# Patient Record
Sex: Female | Born: 1995 | Race: White | Hispanic: No | Marital: Married | Smoking: Never smoker
Health system: Southern US, Community
[De-identification: ages and names within clinical notes are randomized; demographics above are authoritative.]

## PROBLEM LIST (undated history)

## (undated) DIAGNOSIS — Z789 Other specified health status: Secondary | ICD-10-CM

## (undated) HISTORY — PX: WISDOM TOOTH EXTRACTION: SHX21

---
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
## 2017-02-08 DIAGNOSIS — H5203 Hypermetropia, bilateral: Secondary | ICD-10-CM | POA: Diagnosis not present

## 2017-06-07 DIAGNOSIS — N9489 Other specified conditions associated with female genital organs and menstrual cycle: Secondary | ICD-10-CM | POA: Diagnosis not present

## 2018-02-09 DIAGNOSIS — H5213 Myopia, bilateral: Secondary | ICD-10-CM | POA: Diagnosis not present

## 2018-02-19 DIAGNOSIS — Z131 Encounter for screening for diabetes mellitus: Secondary | ICD-10-CM | POA: Diagnosis not present

## 2018-05-20 DIAGNOSIS — Z3009 Encounter for other general counseling and advice on contraception: Secondary | ICD-10-CM | POA: Diagnosis not present

## 2018-08-11 DIAGNOSIS — F419 Anxiety disorder, unspecified: Secondary | ICD-10-CM | POA: Diagnosis not present

## 2018-09-08 DIAGNOSIS — F419 Anxiety disorder, unspecified: Secondary | ICD-10-CM | POA: Diagnosis not present

## 2018-10-21 DIAGNOSIS — Z23 Encounter for immunization: Secondary | ICD-10-CM | POA: Diagnosis not present

## 2019-02-05 DIAGNOSIS — H5203 Hypermetropia, bilateral: Secondary | ICD-10-CM | POA: Diagnosis not present

## 2019-02-13 DIAGNOSIS — F419 Anxiety disorder, unspecified: Secondary | ICD-10-CM | POA: Diagnosis not present

## 2019-02-13 DIAGNOSIS — Z8279 Family history of other congenital malformations, deformations and chromosomal abnormalities: Secondary | ICD-10-CM | POA: Diagnosis not present

## 2019-02-16 ENCOUNTER — Other Ambulatory Visit: Payer: Self-pay | Admitting: Physician Assistant

## 2019-02-16 ENCOUNTER — Other Ambulatory Visit (HOSPITAL_COMMUNITY): Payer: Self-pay | Admitting: Physician Assistant

## 2019-02-16 DIAGNOSIS — Z8279 Family history of other congenital malformations, deformations and chromosomal abnormalities: Secondary | ICD-10-CM

## 2019-02-24 ENCOUNTER — Encounter: Payer: Self-pay | Admitting: Physician Assistant

## 2019-03-10 ENCOUNTER — Telehealth: Payer: Self-pay

## 2019-03-10 NOTE — Telephone Encounter (Signed)
New message    Just an FYI. We have made several attempts to contact this patient including sending a letter to schedule or reschedule their echocardiogram. We will be removing the patient from the echo WQ.   Thank you 

## 2019-03-27 DIAGNOSIS — F419 Anxiety disorder, unspecified: Secondary | ICD-10-CM | POA: Diagnosis not present

## 2019-04-28 MED FILL — ESCITALOPRAM 10 MG TABLET: 10 | 30 days supply | Qty: 30 | Fill #0

## 2019-04-28 MED FILL — TRI FEMYNOR 28 TABLET: 0.18/0.215/ | 84 days supply | Qty: 84 | Fill #0

## 2021-09-21 DIAGNOSIS — H5203 Hypermetropia, bilateral: Secondary | ICD-10-CM | POA: Diagnosis not present

## 2022-02-14 DIAGNOSIS — Z113 Encounter for screening for infections with a predominantly sexual mode of transmission: Secondary | ICD-10-CM | POA: Diagnosis not present

## 2022-02-14 DIAGNOSIS — Z0000 Encounter for general adult medical examination without abnormal findings: Secondary | ICD-10-CM | POA: Diagnosis not present

## 2022-02-14 DIAGNOSIS — F411 Generalized anxiety disorder: Secondary | ICD-10-CM | POA: Diagnosis not present

## 2022-03-21 DIAGNOSIS — F411 Generalized anxiety disorder: Secondary | ICD-10-CM | POA: Diagnosis not present

## 2022-03-21 DIAGNOSIS — R7989 Other specified abnormal findings of blood chemistry: Secondary | ICD-10-CM | POA: Diagnosis not present

## 2022-03-24 ENCOUNTER — Other Ambulatory Visit: Payer: Self-pay

## 2022-03-24 ENCOUNTER — Emergency Department (HOSPITAL_BASED_OUTPATIENT_CLINIC_OR_DEPARTMENT_OTHER)
Admission: EM | Admit: 2022-03-24 | Discharge: 2022-03-24 | Disposition: A | Discharge status: Home / Self Care | Payer: No Typology Code available for payment source | Attending: Emergency Medicine | Admitting: Emergency Medicine

## 2022-03-24 ENCOUNTER — Encounter (HOSPITAL_BASED_OUTPATIENT_CLINIC_OR_DEPARTMENT_OTHER): Payer: Self-pay

## 2022-03-24 DIAGNOSIS — S51831A Puncture wound without foreign body of right forearm, initial encounter: Secondary | ICD-10-CM | POA: Diagnosis not present

## 2022-03-24 DIAGNOSIS — W5501XA Bitten by cat, initial encounter: Secondary | ICD-10-CM | POA: Insufficient documentation

## 2022-03-24 DIAGNOSIS — S51851A Open bite of right forearm, initial encounter: Secondary | ICD-10-CM

## 2022-03-24 DIAGNOSIS — S59911A Unspecified injury of right forearm, initial encounter: Secondary | ICD-10-CM | POA: Diagnosis present

## 2022-03-24 MED ORDER — DOXYCYCLINE HYCLATE 100 MG PO TABS
100.0000 mg | ORAL_TABLET | Freq: Once | ORAL | Status: AC
  Administered 2022-03-24: 100 mg via ORAL
  Filled 2022-03-24: qty 1

## 2022-03-24 MED ORDER — DOXYCYCLINE HYCLATE 100 MG PO CAPS: 100.0000 mg | ORAL_CAPSULE | Freq: Two times a day (BID) | ORAL | 0 refills | Status: AC

## 2022-03-24 NOTE — ED Notes (Signed)
ED Provider at bedside. 

## 2022-03-24 NOTE — Discharge Instructions (Addendum)
Fortunately, your wound does not seem to be severe today.  We thoroughly soaked it in solution and wrapped it.  You can change the dressing once daily.  Wash the area with soap and water, please avoid using antiseptic such as hydrogen peroxide or alcohol as this can delay healing.  Your first dose of antibiotic was given here in the emergency department, your next dose will be taken tomorrow, and you will take this twice daily for 5 days.  Return if you develop signs of infection including increased warmth, redness, drainage or worsening pain.  You may feel sore for the next couple of days -I recommend taking ibuprofen or Tylenol as needed. ?
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
## 2022-03-24 NOTE — ED Provider Notes (Signed)
?[name] ?Provider Note ? ? ?CSN: [id number] ?Arrival date & time: 03/24/22  1633 ? ?  ? ?History ? ?Chief Complaint  ?Patient presents with  ? Animal Bite  ? ? ?Cynthia Nolan is a 26 y.o. female who presents to the ED for evaluation of cat bite of her right forearm that occurred while at work today.  Patient works at an Program[employer], systems and when attempting to place the young cat into the carrier in order to go to his new home, the cat became stressed and bit her.  Reportedly flush the puncture wounds out with a syringe of alcohol while at the animal shelter prior to arrival.  Patient denies numbness and tingling but reports soreness/achiness.  She has no other complaints. ? ? ?Animal Bite ? ?  ? ?Home Medications ?Prior to Admission medications   ?Medication Sig Start Date End Date Taking? Authorizing Provider  ?doxycycline (VIBRAMYCIN) 100 MG capsule Take 1 capsule (100 mg total) by mouth 2 (two) times daily for 5 days. 03/24/22 03/29/22 Yes Tonye Pearson, PA-C  ?   ? ?Allergies    ?Amoxicillin   ? ?Review of Systems   ?Review of Systems ? ?Physical Exam ?Updated Vital Signs ?BP 124/88 (BP Location: Left Arm)   Pulse 82   Temp 98.5 ?F (36.9 ?C) (Oral)   Resp 16   Ht 5' 5.75" (1.67 m)   Wt 72.6 kg   LMP 03/15/2022 (Approximate)   SpO2 100%   BMI 26.02 kg/m?  ?Physical Exam ?Vitals and nursing note reviewed.  ?Constitutional:   ?   General: She is not in acute distress. ?   Appearance: She is not ill-appearing.  ?HENT:  ?   Head: Atraumatic.  ?Eyes:  ?   Conjunctiva/sclera: Conjunctivae normal.  ?Cardiovascular:  ?   Rate and Rhythm: Normal rate and regular rhythm.  ?   Pulses: Normal pulses.     ?     Radial pulses are 2+ on the right side and 2+ on the left side.  ?   Heart sounds: No murmur heard. ?Pulmonary:  ?   Effort: Pulmonary effort is normal. No respiratory distress.  ?   Breath sounds: Normal breath sounds.  ?Abdominal:  ?   General: Abdomen is flat. There is no  distension.  ?   Palpations: Abdomen is soft.  ?   Tenderness: There is no abdominal tenderness.  ?Musculoskeletal:     ?   General: Normal range of motion.  ?   Cervical back: Normal range of motion.  ?Skin: ?   General: Skin is warm and dry.  ?   Capillary Refill: Capillary refill takes less than 2 seconds.  ?   Comments: Four small puncture wounds and scratches to right arm  ?Neurological:  ?   General: No focal deficit present.  ?   Mental Status: She is alert.  ?Psychiatric:     ?   Mood and Affect: Mood normal.  ? ? ?ED Results / Procedures / Treatments   ?Labs ?(all labs ordered are listed, but only abnormal results are displayed) ?Labs Reviewed - No data to display ? ?EKG ?None ? ?Radiology ?No results found. ? ?Procedures ?Procedures  ? ? ?Medications Ordered in ED ?Medications  ?doxycycline (VIBRA-TABS) tablet 100 mg (has no administration in time range)  ? ? ?ED Course/ Medical Decision Making/ A&P ?  ?                        ?  Medical Decision Making ?Risk ?Prescription drug management. ? ? ?History:  ?Per HPI ?Social determinants of health: none ? ?Initial impression: ? ?This patient presents to the ED for concern of cat bite, this involves an extensive number of treatment options, and is a complaint that carries with it a high risk of complications and morbidity.    ?Patient is overall well-appearing, no acute distress.  Her vitals are normal.  The wound itself is small puncture wounds and superficial scratches that have already scabbed over.  No laceration repair required.  Wounds will be soaked in Betadine solution for approximately 15 to 20 minutes.  First dose of antibiotics given here in the emergency department.  Patient has an allergy to amoxicillin, so will provide doxycycline instead.  ? ? ?Medicines ordered and prescription drug management: ? ?I ordered medication including: ?Doxycycline 100mg    ?I have reviewed the patients home medicines and have made adjustments as  needed ? ? ?Disposition: ? ?After consideration of the diagnostic results, physical exam, history and the patients response to treatment feel that the patent would benefit from discharge.   ?Cat bite: First dose of antibiotics given here in the emergency department.  Patient advised on signs and symptoms that warrant emergent return to the ED for evaluation.  Patient inquires about whether or not she still able to get a tattoo in 2 days given that she is on antibiotics.  Pt states Internet search informed her that tattoo while on antibiotics can affect the way thte ink sets.  No contraindications medically for this found on up-to-date, however advised patient to either postpone, or to consult with her tattoo artist if the concern is more aesthetic.  Patient expresses understanding is amenable to plan.  Discharged home in good condition. ? ? ?Final Clinical Impression(s) / ED Diagnoses ?Final diagnoses:  ?Cat bite of forearm, right, initial encounter  ? ? ?Rx / DC Orders ?ED Discharge Orders   ? ?      Ordered  ?  doxycycline (VIBRAMYCIN) 100 MG capsule  2 times daily       ? 03/24/22 1721  ? ?  ?  ? ?  ? ? ?  ?Tonye Pearson, [place] ?03/24/22 1741 ? ?  ?Drenda Freeze, MD ?03/24/22 2256 ? ?

## 2022-04-18 ENCOUNTER — Other Ambulatory Visit (HOSPITAL_COMMUNITY): Payer: Self-pay

## 2022-04-18 MED ORDER — CITALOPRAM HYDROBROMIDE 10 MG PO TABS
ORAL_TABLET | ORAL | 2 refills | Status: DC
  Filled 2022-04-18 – 2022-04-27 (×2): qty 30, 30d supply, fill #0

## 2022-04-26 ENCOUNTER — Other Ambulatory Visit (HOSPITAL_COMMUNITY): Payer: Self-pay

## 2022-04-27 ENCOUNTER — Ambulatory Visit
Admission: EM | Admit: 2022-04-27 | Discharge: 2022-04-27 | Disposition: A | Discharge status: Home / Self Care | Payer: 59 | Attending: Nurse Practitioner | Admitting: Nurse Practitioner

## 2022-04-27 ENCOUNTER — Other Ambulatory Visit (HOSPITAL_COMMUNITY): Payer: Self-pay

## 2022-04-27 DIAGNOSIS — S161XXA Strain of muscle, fascia and tendon at neck level, initial encounter: Secondary | ICD-10-CM

## 2022-04-27 MED ORDER — IBUPROFEN 800 MG PO TABS
800.0000 mg | ORAL_TABLET | Freq: Three times a day (TID) | ORAL | 0 refills | Status: DC | PRN
  Filled 2022-04-27: qty 30, 10d supply, fill #0

## 2022-04-27 MED ORDER — ONDANSETRON 4 MG PO TBDP
4.0000 mg | ORAL_TABLET | Freq: Three times a day (TID) | ORAL | 0 refills | Status: DC | PRN
  Filled 2022-04-27: qty 20, 7d supply, fill #0

## 2022-04-27 MED ORDER — CYCLOBENZAPRINE HCL 10 MG PO TABS
10.0000 mg | ORAL_TABLET | Freq: Two times a day (BID) | ORAL | 0 refills | Status: DC | PRN
  Filled 2022-04-27: qty 20, 10d supply, fill #0
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
## 2022-04-27 NOTE — Discharge Instructions (Addendum)
Take medication as prescribed. ?Warm compresses or warm showers to help with your symptoms. ?Gentle range of motion and stretching to help promote mobility of your neck. ?Activity as tolerated. ?Follow-up if your symptoms do not improve within the next 10 to 14 days. ?
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
## 2022-07-16 ENCOUNTER — Other Ambulatory Visit (HOSPITAL_COMMUNITY): Payer: Self-pay

## 2022-07-16 MED ORDER — CITALOPRAM HYDROBROMIDE 10 MG PO TABS
ORAL_TABLET | ORAL | 2 refills | Status: DC
  Filled 2022-07-16 – 2022-08-29 (×2): qty 30, 30d supply, fill #0

## 2022-07-25 ENCOUNTER — Other Ambulatory Visit (HOSPITAL_COMMUNITY): Payer: Self-pay

## 2022-08-29 ENCOUNTER — Other Ambulatory Visit (HOSPITAL_COMMUNITY): Payer: Self-pay

## 2022-09-27 DIAGNOSIS — H5203 Hypermetropia, bilateral: Secondary | ICD-10-CM | POA: Diagnosis not present

## 2022-10-05 ENCOUNTER — Other Ambulatory Visit (HOSPITAL_COMMUNITY): Payer: Self-pay

## 2022-10-05 MED ORDER — CITALOPRAM HYDROBROMIDE 10 MG PO TABS
ORAL_TABLET | ORAL | 2 refills | Status: DC
  Filled 2022-10-05: qty 30, 30d supply, fill #0

## 2022-11-07 ENCOUNTER — Other Ambulatory Visit (HOSPITAL_COMMUNITY): Payer: Self-pay

## 2022-11-07 DIAGNOSIS — Z3201 Encounter for pregnancy test, result positive: Secondary | ICD-10-CM | POA: Diagnosis not present

## 2022-11-07 DIAGNOSIS — R300 Dysuria: Secondary | ICD-10-CM | POA: Diagnosis not present

## 2022-11-07 MED ORDER — NITROFURANTOIN MONOHYD MACRO 100 MG PO CAPS
100.0000 mg | ORAL_CAPSULE | Freq: Two times a day (BID) | ORAL | 0 refills | Status: DC
  Filled 2022-11-07: qty 10, 5d supply, fill #0

## 2022-11-20 DIAGNOSIS — Z23 Encounter for immunization: Secondary | ICD-10-CM | POA: Diagnosis not present

## 2022-11-20 DIAGNOSIS — Z3201 Encounter for pregnancy test, result positive: Secondary | ICD-10-CM | POA: Diagnosis not present

## 2022-12-05 DIAGNOSIS — Z368A Encounter for antenatal screening for other genetic defects: Secondary | ICD-10-CM | POA: Diagnosis not present

## 2022-12-05 DIAGNOSIS — O26891 Other specified pregnancy related conditions, first trimester: Secondary | ICD-10-CM | POA: Diagnosis not present

## 2022-12-05 DIAGNOSIS — Z1151 Encounter for screening for human papillomavirus (HPV): Secondary | ICD-10-CM | POA: Diagnosis not present

## 2022-12-05 DIAGNOSIS — Z3A09 9 weeks gestation of pregnancy: Secondary | ICD-10-CM | POA: Diagnosis not present

## 2022-12-05 DIAGNOSIS — Z3401 Encounter for supervision of normal first pregnancy, first trimester: Secondary | ICD-10-CM | POA: Diagnosis not present

## 2022-12-05 DIAGNOSIS — Z113 Encounter for screening for infections with a predominantly sexual mode of transmission: Secondary | ICD-10-CM | POA: Diagnosis not present

## 2022-12-05 DIAGNOSIS — Z369 Encounter for antenatal screening, unspecified: Secondary | ICD-10-CM | POA: Diagnosis not present

## 2022-12-05 DIAGNOSIS — Z124 Encounter for screening for malignant neoplasm of cervix: Secondary | ICD-10-CM | POA: Diagnosis not present

## 2023-02-12 DIAGNOSIS — Z3402 Encounter for supervision of normal first pregnancy, second trimester: Secondary | ICD-10-CM | POA: Diagnosis not present

## 2023-02-12 DIAGNOSIS — Z3A18 18 weeks gestation of pregnancy: Secondary | ICD-10-CM | POA: Diagnosis not present

## 2023-02-12 DIAGNOSIS — Z363 Encounter for antenatal screening for malformations: Secondary | ICD-10-CM | POA: Diagnosis not present

## 2023-03-12 DIAGNOSIS — Z3A22 22 weeks gestation of pregnancy: Secondary | ICD-10-CM | POA: Diagnosis not present

## 2023-03-12 DIAGNOSIS — Z3402 Encounter for supervision of normal first pregnancy, second trimester: Secondary | ICD-10-CM | POA: Diagnosis not present

## 2023-03-12 DIAGNOSIS — Z362 Encounter for other antenatal screening follow-up: Secondary | ICD-10-CM | POA: Diagnosis not present

## 2023-04-10 DIAGNOSIS — Z23 Encounter for immunization: Secondary | ICD-10-CM | POA: Diagnosis not present

## 2023-04-10 DIAGNOSIS — Z3689 Encounter for other specified antenatal screening: Secondary | ICD-10-CM | POA: Diagnosis not present

## 2023-04-11 ENCOUNTER — Other Ambulatory Visit (HOSPITAL_COMMUNITY): Payer: Self-pay

## 2023-04-11 MED ORDER — ACCRUFER 30 MG PO CAPS
30.0000 mg | ORAL_CAPSULE | Freq: Every day | ORAL | 2 refills | Status: DC
  Filled 2023-04-11: qty 30, 30d supply, fill #0

## 2023-04-16 DIAGNOSIS — O99810 Abnormal glucose complicating pregnancy: Secondary | ICD-10-CM | POA: Diagnosis not present

## 2023-04-24 ENCOUNTER — Other Ambulatory Visit (HOSPITAL_COMMUNITY): Payer: Self-pay

## 2023-04-24 MED ORDER — FERROUS SULFATE 325 (65 FE) MG PO TABS
325.0000 mg | ORAL_TABLET | Freq: Every day | ORAL | 1 refills | Status: DC
  Filled 2023-04-24: qty 30, 30d supply, fill #0

## 2023-06-12 DIAGNOSIS — Z3685 Encounter for antenatal screening for Streptococcus B: Secondary | ICD-10-CM | POA: Diagnosis not present

## 2023-06-12 LAB — OB RESULTS CONSOLE GBS: GBS: POSITIVE

## 2023-07-05 ENCOUNTER — Encounter (HOSPITAL_COMMUNITY)
Admission: AD | Disposition: A | Discharge status: Home / Self Care | Payer: Self-pay | Source: Home / Self Care | Attending: Obstetrics and Gynecology

## 2023-07-05 ENCOUNTER — Encounter (HOSPITAL_COMMUNITY): Payer: Self-pay | Admitting: *Deleted

## 2023-07-05 ENCOUNTER — Inpatient Hospital Stay (HOSPITAL_COMMUNITY): Payer: Commercial Managed Care - PPO | Admitting: Anesthesiology

## 2023-07-05 ENCOUNTER — Inpatient Hospital Stay (HOSPITAL_COMMUNITY)
Admission: AD | Admit: 2023-07-05 | Discharge: 2023-07-08 | DRG: 787 | Disposition: A | Discharge status: Home / Self Care | Payer: Commercial Managed Care - PPO | Attending: Obstetrics and Gynecology | Admitting: Obstetrics and Gynecology

## 2023-07-05 ENCOUNTER — Other Ambulatory Visit: Payer: Self-pay

## 2023-07-05 DIAGNOSIS — Z3A39 39 weeks gestation of pregnancy: Secondary | ICD-10-CM | POA: Diagnosis not present

## 2023-07-05 DIAGNOSIS — O26893 Other specified pregnancy related conditions, third trimester: Secondary | ICD-10-CM | POA: Diagnosis present

## 2023-07-05 DIAGNOSIS — Z6835 Body mass index (BMI) 35.0-35.9, adult: Secondary | ICD-10-CM | POA: Diagnosis not present

## 2023-07-05 DIAGNOSIS — E669 Obesity, unspecified: Secondary | ICD-10-CM | POA: Diagnosis not present

## 2023-07-05 DIAGNOSIS — D62 Acute posthemorrhagic anemia: Secondary | ICD-10-CM | POA: Diagnosis not present

## 2023-07-05 DIAGNOSIS — O99824 Streptococcus B carrier state complicating childbirth: Secondary | ICD-10-CM | POA: Diagnosis present

## 2023-07-05 DIAGNOSIS — O9081 Anemia of the puerperium: Secondary | ICD-10-CM | POA: Diagnosis not present

## 2023-07-05 DIAGNOSIS — O134 Gestational [pregnancy-induced] hypertension without significant proteinuria, complicating childbirth: Secondary | ICD-10-CM | POA: Diagnosis not present

## 2023-07-05 DIAGNOSIS — Z3689 Encounter for other specified antenatal screening: Secondary | ICD-10-CM | POA: Diagnosis not present

## 2023-07-05 DIAGNOSIS — O99213 Obesity complicating pregnancy, third trimester: Secondary | ICD-10-CM | POA: Diagnosis not present

## 2023-07-05 HISTORY — DX: Other specified health status: Z78.9

## 2023-07-05 LAB — CBC
HCT: 33.5 % — ABNORMAL LOW (ref 36.0–46.0)
Hemoglobin: 10.3 g/dL — ABNORMAL LOW (ref 12.0–15.0)
MCH: 23.6 pg — ABNORMAL LOW (ref 26.0–34.0)
MCHC: 30.7 g/dL (ref 30.0–36.0)
MCV: 76.7 fL — ABNORMAL LOW (ref 80.0–100.0)
Platelets: 222 10*3/uL (ref 150–400)
RBC: 4.37 MIL/uL (ref 3.87–5.11)
RDW: 15.6 % — ABNORMAL HIGH (ref 11.5–15.5)
WBC: 16.6 10*3/uL — ABNORMAL HIGH (ref 4.0–10.5)
nRBC: 0 % (ref 0.0–0.2)

## 2023-07-05 LAB — COMPREHENSIVE METABOLIC PANEL
ALT: 16 U/L (ref 0–44)
AST: 27 U/L (ref 15–41)
Albumin: 2.5 g/dL — ABNORMAL LOW (ref 3.5–5.0)
Alkaline Phosphatase: 132 U/L — ABNORMAL HIGH (ref 38–126)
Anion gap: 12 (ref 5–15)
BUN: 6 mg/dL (ref 6–20)
CO2: 17 mmol/L — ABNORMAL LOW (ref 22–32)
Calcium: 9.1 mg/dL (ref 8.9–10.3)
Chloride: 106 mmol/L (ref 98–111)
Creatinine, Ser: 0.75 mg/dL (ref 0.44–1.00)
GFR, Estimated: >60 mL/min (ref >60)
Glucose, Bld: 101 mg/dL — ABNORMAL HIGH (ref 70–99)
Potassium: 3.6 mmol/L (ref 3.5–5.1)
Sodium: 135 mmol/L (ref 135–145)
Total Bilirubin: 0.1 mg/dL — ABNORMAL LOW (ref 0.3–1.2)
Total Protein: 6.4 g/dL — ABNORMAL LOW (ref 6.5–8.1)

## 2023-07-05 LAB — TYPE AND SCREEN
ABO/RH(D): A POS
Antibody Screen: NEGATIVE

## 2023-07-05 LAB — PROTEIN / CREATININE RATIO, URINE
Creatinine, Urine: 74 mg/dL
Protein Creatinine Ratio: 3.88 mg/mg{Cre} — ABNORMAL HIGH (ref 0.00–0.15)
Total Protein, Urine: 287 mg/dL

## 2023-07-05 LAB — RPR: RPR Ser Ql: NONREACTIVE

## 2023-07-05 SURGERY — Surgical Case
Anesthesia: Epidural

## 2023-07-05 MED ORDER — LIDOCAINE HCL (PF) 1 % IJ SOLN
INTRAMUSCULAR | Status: DC | PRN
  Administered 2023-07-05: 5 mL via EPIDURAL

## 2023-07-05 MED ORDER — SENNOSIDES-DOCUSATE SODIUM 8.6-50 MG PO TABS
2.0000 | ORAL_TABLET | Freq: Every day | ORAL | Status: DC
  Administered 2023-07-06 – 2023-07-08 (×3): 2 via ORAL
  Filled 2023-07-05 (×3): qty 2

## 2023-07-05 MED ORDER — LIDOCAINE-EPINEPHRINE (PF) 2 %-1:200000 IJ SOLN
INTRAMUSCULAR | Status: AC
  Filled 2023-07-05: qty 20

## 2023-07-05 MED ORDER — OXYCODONE-ACETAMINOPHEN 5-325 MG PO TABS: 2.0000 | ORAL_TABLET | ORAL | Status: DC | PRN

## 2023-07-05 MED ORDER — NALOXONE HCL 4 MG/10ML IJ SOLN: 1.0000 ug/kg/h | INTRAVENOUS | Status: DC | PRN

## 2023-07-05 MED ORDER — KETOROLAC TROMETHAMINE 30 MG/ML IJ SOLN
INTRAMUSCULAR | Status: AC
  Filled 2023-07-05: qty 1

## 2023-07-05 MED ORDER — DEXAMETHASONE SODIUM PHOSPHATE 10 MG/ML IJ SOLN
INTRAMUSCULAR | Status: AC
  Filled 2023-07-05: qty 1

## 2023-07-05 MED ORDER — OXYTOCIN-SODIUM CHLORIDE 30-0.9 UT/500ML-% IV SOLN: 2.5000 [IU]/h | INTRAVENOUS | Status: DC

## 2023-07-05 MED ORDER — PHENYLEPHRINE 80 MCG/ML (10ML) SYRINGE FOR IV PUSH (FOR BLOOD PRESSURE SUPPORT)
PREFILLED_SYRINGE | INTRAVENOUS | Status: AC
  Filled 2023-07-05: qty 10

## 2023-07-05 MED ORDER — MEPERIDINE HCL 25 MG/ML IJ SOLN: 6.2500 mg | INTRAMUSCULAR | Status: DC | PRN

## 2023-07-05 MED ORDER — TRANEXAMIC ACID-NACL 1000-0.7 MG/100ML-% IV SOLN
INTRAVENOUS | Status: DC | PRN
  Administered 2023-07-05: 1000 mg via INTRAVENOUS

## 2023-07-05 MED ORDER — FENTANYL CITRATE (PF) 100 MCG/2ML IJ SOLN
50.0000 ug | INTRAMUSCULAR | Status: DC | PRN
  Administered 2023-07-05: 100 ug via INTRAVENOUS
  Filled 2023-07-05: qty 2

## 2023-07-05 MED ORDER — ONDANSETRON HCL 4 MG/2ML IJ SOLN: 4.0000 mg | Freq: Once | INTRAMUSCULAR | Status: DC | PRN

## 2023-07-05 MED ORDER — OXYTOCIN BOLUS FROM INFUSION: 333.0000 mL | Freq: Once | INTRAVENOUS | Status: DC

## 2023-07-05 MED ORDER — DEXMEDETOMIDINE HCL IN NACL 200 MCG/50ML IV SOLN
INTRAVENOUS | Status: DC | PRN
  Administered 2023-07-05 (×2): 8 ug via INTRAVENOUS

## 2023-07-05 MED ORDER — VANCOMYCIN HCL 10 G IV SOLR
20.0000 mg/kg | Freq: Three times a day (TID) | INTRAVENOUS | Status: DC
  Filled 2023-07-05 (×2): qty 19.4

## 2023-07-05 MED ORDER — PHENYLEPHRINE 80 MCG/ML (10ML) SYRINGE FOR IV PUSH (FOR BLOOD PRESSURE SUPPORT)
PREFILLED_SYRINGE | INTRAVENOUS | Status: DC | PRN
  Administered 2023-07-05: 160 ug via INTRAVENOUS

## 2023-07-05 MED ORDER — FENTANYL CITRATE (PF) 100 MCG/2ML IJ SOLN
INTRAMUSCULAR | Status: AC
  Administered 2023-07-05: 100 ug
  Filled 2023-07-05: qty 2

## 2023-07-05 MED ORDER — FENTANYL-BUPIVACAINE-NACL 0.5-0.125-0.9 MG/250ML-% EP SOLN
12.0000 mL/h | EPIDURAL | Status: DC | PRN
  Filled 2023-07-05: qty 250

## 2023-07-05 MED ORDER — WITCH HAZEL-GLYCERIN EX PADS: 1.0000 | MEDICATED_PAD | CUTANEOUS | Status: DC | PRN

## 2023-07-05 MED ORDER — MORPHINE SULFATE (PF) 0.5 MG/ML IJ SOLN
INTRAMUSCULAR | Status: AC
  Filled 2023-07-05: qty 10

## 2023-07-05 MED ORDER — LACTATED RINGERS IV SOLN
500.0000 mL | INTRAVENOUS | Status: DC | PRN
  Administered 2023-07-05: 500 mL via INTRAVENOUS
  Administered 2023-07-05: 1000 mL via INTRAVENOUS

## 2023-07-05 MED ORDER — OXYCODONE-ACETAMINOPHEN 5-325 MG PO TABS: 1.0000 | ORAL_TABLET | ORAL | Status: DC | PRN

## 2023-07-05 MED ORDER — DROPERIDOL 2.5 MG/ML IJ SOLN
INTRAMUSCULAR | Status: DC | PRN
  Administered 2023-07-05: .625 mg via INTRAVENOUS

## 2023-07-05 MED ORDER — DIBUCAINE (PERIANAL) 1 % EX OINT: 1.0000 | TOPICAL_OINTMENT | CUTANEOUS | Status: DC | PRN

## 2023-07-05 MED ORDER — SOD CITRATE-CITRIC ACID 500-334 MG/5ML PO SOLN
30.0000 mL | ORAL | Status: DC | PRN
  Administered 2023-07-05: 30 mL via ORAL
  Filled 2023-07-05: qty 30

## 2023-07-05 MED ORDER — EPHEDRINE 5 MG/ML INJ: 10.0000 mg | INTRAVENOUS | Status: DC | PRN

## 2023-07-05 MED ORDER — OXYTOCIN-SODIUM CHLORIDE 30-0.9 UT/500ML-% IV SOLN: 2.5000 [IU]/h | INTRAVENOUS | Status: AC

## 2023-07-05 MED ORDER — SIMETHICONE 80 MG PO CHEW: 80.0000 mg | CHEWABLE_TABLET | ORAL | Status: DC | PRN

## 2023-07-05 MED ORDER — SODIUM BICARBONATE 8.4 % IV SOLN
INTRAVENOUS | Status: AC
  Filled 2023-07-05: qty 50

## 2023-07-05 MED ORDER — FENTANYL CITRATE (PF) 100 MCG/2ML IJ SOLN
INTRAMUSCULAR | Status: AC
  Filled 2023-07-05: qty 2

## 2023-07-05 MED ORDER — ACETAMINOPHEN 500 MG PO TABS
1000.0000 mg | ORAL_TABLET | Freq: Four times a day (QID) | ORAL | Status: DC
  Administered 2023-07-05 – 2023-07-08 (×11): 1000 mg via ORAL
  Filled 2023-07-05 (×12): qty 2

## 2023-07-05 MED ORDER — FLEET ENEMA 7-19 GM/118ML RE ENEM: 1.0000 | ENEMA | RECTAL | Status: DC | PRN

## 2023-07-05 MED ORDER — ONDANSETRON HCL 4 MG/2ML IJ SOLN: 4.0000 mg | Freq: Three times a day (TID) | INTRAMUSCULAR | Status: DC | PRN

## 2023-07-05 MED ORDER — PHENYLEPHRINE 80 MCG/ML (10ML) SYRINGE FOR IV PUSH (FOR BLOOD PRESSURE SUPPORT): 80.0000 ug | PREFILLED_SYRINGE | INTRAVENOUS | Status: DC | PRN

## 2023-07-05 MED ORDER — FENTANYL-BUPIVACAINE-NACL 0.5-0.125-0.9 MG/250ML-% EP SOLN
EPIDURAL | Status: DC | PRN
  Administered 2023-07-05: 12 mL/h via EPIDURAL

## 2023-07-05 MED ORDER — LIDOCAINE-EPINEPHRINE (PF) 2 %-1:200000 IJ SOLN
INTRAMUSCULAR | Status: DC | PRN
  Administered 2023-07-05: 5 mL via EPIDURAL
  Administered 2023-07-05 (×2): 4 mL via EPIDURAL
  Administered 2023-07-05: 5 mL via EPIDURAL
  Administered 2023-07-05: 2 mL via EPIDURAL

## 2023-07-05 MED ORDER — HYDROMORPHONE HCL 1 MG/ML IJ SOLN: 0.2500 mg | INTRAMUSCULAR | Status: DC | PRN

## 2023-07-05 MED ORDER — SOD CITRATE-CITRIC ACID 500-334 MG/5ML PO SOLN: 30.0000 mL | ORAL | Status: DC

## 2023-07-05 MED ORDER — CEFAZOLIN SODIUM-DEXTROSE 1-4 GM/50ML-% IV SOLN
1.0000 g | Freq: Three times a day (TID) | INTRAVENOUS | Status: DC
  Filled 2023-07-05 (×2): qty 50

## 2023-07-05 MED ORDER — OXYCODONE HCL 5 MG PO TABS
5.0000 mg | ORAL_TABLET | ORAL | Status: DC | PRN
  Administered 2023-07-07 – 2023-07-08 (×4): 10 mg via ORAL
  Filled 2023-07-05 (×3): qty 2

## 2023-07-05 MED ORDER — DIPHENHYDRAMINE HCL 25 MG PO CAPS: 25.0000 mg | ORAL_CAPSULE | Freq: Four times a day (QID) | ORAL | Status: DC | PRN

## 2023-07-05 MED ORDER — KETOROLAC TROMETHAMINE 30 MG/ML IJ SOLN
30.0000 mg | Freq: Once | INTRAMUSCULAR | Status: AC | PRN
  Administered 2023-07-05: 30 mg via INTRAVENOUS

## 2023-07-05 MED ORDER — ACETAMINOPHEN 325 MG PO TABS: 650.0000 mg | ORAL_TABLET | ORAL | Status: DC | PRN

## 2023-07-05 MED ORDER — SODIUM CHLORIDE 0.9 % IV SOLN
500.0000 mg | INTRAVENOUS | Status: AC
  Administered 2023-07-05: 250 mg via INTRAVENOUS

## 2023-07-05 MED ORDER — STERILE WATER FOR IRRIGATION IR SOLN
Status: DC | PRN
  Administered 2023-07-05: 1

## 2023-07-05 MED ORDER — DEXAMETHASONE SODIUM PHOSPHATE 10 MG/ML IJ SOLN
INTRAMUSCULAR | Status: DC | PRN
  Administered 2023-07-05: 10 mg via INTRAVENOUS

## 2023-07-05 MED ORDER — KETOROLAC TROMETHAMINE 30 MG/ML IJ SOLN
30.0000 mg | Freq: Four times a day (QID) | INTRAMUSCULAR | Status: AC
  Administered 2023-07-05 – 2023-07-06 (×3): 30 mg via INTRAVENOUS
  Filled 2023-07-05 (×3): qty 1

## 2023-07-05 MED ORDER — MENTHOL 3 MG MT LOZG: 1.0000 | LOZENGE | OROMUCOSAL | Status: DC | PRN

## 2023-07-05 MED ORDER — SIMETHICONE 80 MG PO CHEW
80.0000 mg | CHEWABLE_TABLET | Freq: Three times a day (TID) | ORAL | Status: DC
  Administered 2023-07-05 – 2023-07-08 (×7): 80 mg via ORAL
  Filled 2023-07-05 (×7): qty 1

## 2023-07-05 MED ORDER — SODIUM CHLORIDE 0.9% FLUSH: 3.0000 mL | INTRAVENOUS | Status: DC | PRN

## 2023-07-05 MED ORDER — OXYCODONE HCL 5 MG/5ML PO SOLN: 5.0000 mg | Freq: Once | ORAL | Status: DC | PRN

## 2023-07-05 MED ORDER — CEFAZOLIN SODIUM-DEXTROSE 2-4 GM/100ML-% IV SOLN
2.0000 g | INTRAVENOUS | Status: AC
  Administered 2023-07-05: 2 g via INTRAVENOUS

## 2023-07-05 MED ORDER — MORPHINE SULFATE (PF) 0.5 MG/ML IJ SOLN
INTRAMUSCULAR | Status: DC | PRN
  Administered 2023-07-05: 3 mg via EPIDURAL

## 2023-07-05 MED ORDER — OXYTOCIN-SODIUM CHLORIDE 30-0.9 UT/500ML-% IV SOLN
INTRAVENOUS | Status: DC | PRN
  Administered 2023-07-05: 300 mL via INTRAVENOUS

## 2023-07-05 MED ORDER — DIPHENHYDRAMINE HCL 50 MG/ML IJ SOLN: 12.5000 mg | INTRAMUSCULAR | Status: DC | PRN

## 2023-07-05 MED ORDER — OXYCODONE HCL 5 MG PO TABS: 5.0000 mg | ORAL_TABLET | Freq: Once | ORAL | Status: DC | PRN

## 2023-07-05 MED ORDER — CEFAZOLIN SODIUM-DEXTROSE 2-4 GM/100ML-% IV SOLN
2.0000 g | Freq: Once | INTRAVENOUS | Status: AC
  Administered 2023-07-05: 2 g via INTRAVENOUS
  Filled 2023-07-05: qty 100

## 2023-07-05 MED ORDER — IBUPROFEN 600 MG PO TABS
600.0000 mg | ORAL_TABLET | Freq: Four times a day (QID) | ORAL | Status: DC
  Administered 2023-07-06 – 2023-07-08 (×9): 600 mg via ORAL
  Filled 2023-07-05 (×9): qty 1

## 2023-07-05 MED ORDER — COCONUT OIL OIL: 1.0000 | TOPICAL_OIL | Status: DC | PRN

## 2023-07-05 MED ORDER — SCOPOLAMINE 1 MG/3DAYS TD PT72
MEDICATED_PATCH | TRANSDERMAL | Status: AC
  Filled 2023-07-05: qty 1

## 2023-07-05 MED ORDER — ZOLPIDEM TARTRATE 5 MG PO TABS: 5.0000 mg | ORAL_TABLET | Freq: Every evening | ORAL | Status: DC | PRN

## 2023-07-05 MED ORDER — ACETAMINOPHEN 10 MG/ML IV SOLN
INTRAVENOUS | Status: DC | PRN
  Administered 2023-07-05: 1000 mg via INTRAVENOUS

## 2023-07-05 MED ORDER — ONDANSETRON HCL 4 MG/2ML IJ SOLN
INTRAMUSCULAR | Status: DC | PRN
  Administered 2023-07-05: 4 mg via INTRAVENOUS

## 2023-07-05 MED ORDER — MORPHINE SULFATE (PF) 0.5 MG/ML IJ SOLN
INTRAMUSCULAR | Status: DC | PRN
  Administered 2023-07-05: 2 mg via INTRAVENOUS

## 2023-07-05 MED ORDER — FENTANYL CITRATE (PF) 100 MCG/2ML IJ SOLN
INTRAMUSCULAR | Status: DC | PRN
  Administered 2023-07-05: 100 ug via EPIDURAL

## 2023-07-05 MED ORDER — LACTATED RINGERS IV SOLN
500.0000 mL | Freq: Once | INTRAVENOUS | Status: AC
  Administered 2023-07-05: 500 mL via INTRAVENOUS

## 2023-07-05 MED ORDER — DIPHENHYDRAMINE HCL 25 MG PO CAPS: 25.0000 mg | ORAL_CAPSULE | ORAL | Status: DC | PRN

## 2023-07-05 MED ORDER — NALOXONE HCL 0.4 MG/ML IJ SOLN: 0.4000 mg | INTRAMUSCULAR | Status: DC | PRN

## 2023-07-05 MED ORDER — ONDANSETRON HCL 4 MG/2ML IJ SOLN
INTRAMUSCULAR | Status: AC
  Filled 2023-07-05: qty 2

## 2023-07-05 MED ORDER — LACTATED RINGERS IV SOLN: INTRAVENOUS | Status: DC

## 2023-07-05 MED ORDER — ONDANSETRON HCL 4 MG/2ML IJ SOLN: 4.0000 mg | Freq: Four times a day (QID) | INTRAMUSCULAR | Status: DC | PRN

## 2023-07-05 MED ORDER — SCOPOLAMINE 1 MG/3DAYS TD PT72
1.0000 | MEDICATED_PATCH | Freq: Once | TRANSDERMAL | Status: AC
  Administered 2023-07-05: 1.5 mg via TRANSDERMAL

## 2023-07-05 MED ORDER — TERBUTALINE SULFATE 1 MG/ML IJ SOLN
INTRAMUSCULAR | Status: AC
  Administered 2023-07-05: 0.25 mg via SUBCUTANEOUS
  Filled 2023-07-05: qty 1

## 2023-07-05 MED ORDER — LIDOCAINE HCL (PF) 1 % IJ SOLN: 30.0000 mL | INTRAMUSCULAR | Status: DC | PRN

## 2023-07-05 MED ORDER — TERBUTALINE SULFATE 1 MG/ML IJ SOLN: 0.2500 mg | Freq: Once | INTRAMUSCULAR | Status: AC

## 2023-07-05 MED ORDER — PRENATAL MULTIVITAMIN CH
1.0000 | ORAL_TABLET | Freq: Every day | ORAL | Status: DC
  Administered 2023-07-06 – 2023-07-08 (×3): 1 via ORAL
  Filled 2023-07-05 (×3): qty 1

## 2023-07-05 SURGICAL SUPPLY — 38 items
ADH SKN CLS APL DERMABOND .7 (GAUZE/BANDAGES/DRESSINGS) ×1
APL PRP STRL LF DISP 70% ISPRP (MISCELLANEOUS) ×2
CHLORAPREP W/TINT 26 (MISCELLANEOUS) ×2 IMPLANT
CLAMP UMBILICAL CORD (MISCELLANEOUS) ×1 IMPLANT
CLOTH BEACON ORANGE TIMEOUT ST (SAFETY) ×1 IMPLANT
DERMABOND ADVANCED .7 DNX12 (GAUZE/BANDAGES/DRESSINGS) ×1 IMPLANT
DRSG OPSITE POSTOP 4X10 (GAUZE/BANDAGES/DRESSINGS) ×1 IMPLANT
ELECT REM PT RETURN 9FT ADLT (ELECTROSURGICAL) ×1
ELECTRODE REM PT RTRN 9FT ADLT (ELECTROSURGICAL) ×1 IMPLANT
EXTRACTOR VACUUM BELL STYLE (SUCTIONS) IMPLANT
GAUZE SPONGE 4X4 12PLY STRL LF (GAUZE/BANDAGES/DRESSINGS) IMPLANT
GLOVE BIO SURGEON STRL SZ 6.5 (GLOVE) ×1 IMPLANT
GLOVE BIOGEL PI IND STRL 6.5 (GLOVE) ×1 IMPLANT
GLOVE BIOGEL PI IND STRL 7.0 (GLOVE) ×2 IMPLANT
GOWN STRL REUS W/TWL LRG LVL3 (GOWN DISPOSABLE) ×2 IMPLANT
HEMOSTAT ARISTA ABSORB 3G PWDR (HEMOSTASIS) IMPLANT
KIT ABG SYR 3ML LUER SLIP (SYRINGE) IMPLANT
NDL HYPO 25X5/8 SAFETYGLIDE (NEEDLE) IMPLANT
NEEDLE HYPO 25X5/8 SAFETYGLIDE (NEEDLE) IMPLANT
NS IRRIG 1000ML POUR BTL (IV SOLUTION) ×1 IMPLANT
PACK C SECTION WH (CUSTOM PROCEDURE TRAY) ×1 IMPLANT
PAD ABD 8X10 STRL (GAUZE/BANDAGES/DRESSINGS) IMPLANT
PAD OB MATERNITY 4.3X12.25 (PERSONAL CARE ITEMS) ×1 IMPLANT
RTRCTR C-SECT PINK 25CM LRG (MISCELLANEOUS) IMPLANT
SUT PDS AB 0 CTX 36 PDP370T (SUTURE) IMPLANT
SUT PLAIN 2 0 (SUTURE)
SUT PLAIN ABS 2-0 CT1 27XMFL (SUTURE) IMPLANT
SUT VIC AB 0 CT1 27 (SUTURE) ×2
SUT VIC AB 0 CT1 27XCR 8 STRN (SUTURE) IMPLANT
SUT VIC AB 0 CT1 36 (SUTURE) ×2 IMPLANT
SUT VIC AB 2-0 CT1 27 (SUTURE) ×1
SUT VIC AB 2-0 CT1 TAPERPNT 27 (SUTURE) ×1 IMPLANT
SUT VIC AB 3-0 SH 27 (SUTURE)
SUT VIC AB 3-0 SH 27X BRD (SUTURE) IMPLANT
SUT VIC AB 4-0 KS 27 (SUTURE) ×1 IMPLANT
TOWEL OR 17X24 6PK STRL BLUE (TOWEL DISPOSABLE) ×1 IMPLANT
TRAY FOLEY W/BAG SLVR 14FR LF (SET/KITS/TRAYS/PACK) ×1 IMPLANT
WATER STERILE IRR 1000ML POUR (IV SOLUTION) ×1 IMPLANT

## 2023-07-05 NOTE — Progress Notes (Addendum)
Attempted pushing supine as well as lateral however deep variables persisted, transitioning to late decels in last two contractions. Fetal station still at 0 despite adequate maternal effort. At this time, calling urgent PLTCS for cat 2 tracing remote from delivery  R/B/A of cesarean section discussed with patient. Alternative would be vaginal delivery which would mean shorter postpartum stay and decreased risk of bleeding. Risks of section include infection of the uterus, pelvic organs, or skin, inadvertent injury to internal organs, such as bowel or bladder. If there is major injury, extensive surgery may be required. If injury is minor, it may be treated with relative ease. Discussed possibility of excessive blood loss and transfusion. If bleeding cannot be controlled using medical or minor surgical methods, a cesarean hysterectomy may be performed which would mean no future fertility. Patient accepts the possibility of blood transfusion, if necessary. Patient understands and agrees to move forward with section.  BP (!) 152/88   Pulse (!) 107   Temp 97.9 F (36.6 C) (Oral)   Resp 18   Ht 5\' 5"  (1.651 m)   Wt 97.1 kg   SpO2 95%   BMI 35.61 kg/m   When pushing stopped, variables resolve however unable to restart pushing without tracing as previously noted

## 2023-07-05 NOTE — Progress Notes (Signed)
To BS via w/c °

## 2023-07-05 NOTE — MAU Provider Note (Signed)
Event Date/Time   First Provider Initiated Contact with Patient 07/05/23 0326      S Ms. Cynthia Nolan is a 27 y.o. G1P0 patient who presents to MAU today with complaint of contractions.  Upon arrival patient with elevated blood pressures.  Provider to bedside, patient denies HA, visual disturbances, and RUQ pain.  Patient endorses fetal movement and denies vaginal bleeding or leaking. Coping well with contractions.   Review of [facility]  prenatal records show patient with elevated blood pressures at 31, 36, and 37 weeks with normal findings on retake   O BP (!) 152/95   Pulse 89   Temp 98.1 F (36.7 C)   Resp 18   Ht 5\' 5"  (1.651 m)   Wt 97.1 kg   SpO2 99%   BMI 35.61 kg/m   Vitals:   07/05/23 0403 07/05/23 0423  BP: (!) 150/92 (!) 143/90  Pulse: 88 79  Resp:    Temp:    SpO2:      Physical Exam Vitals reviewed.  Constitutional:      General: She is in acute distress (with contractions).     Appearance: Normal appearance.  HENT:     Head: Normocephalic and atraumatic.  Eyes:     Conjunctiva/sclera: Conjunctivae normal.  Cardiovascular:     Rate and Rhythm: Normal rate and regular rhythm.     Heart sounds: Normal heart sounds.  Pulmonary:     Effort: Pulmonary effort is normal. No respiratory distress.     Breath sounds: Normal breath sounds.  Abdominal:     Tenderness: There is no abdominal tenderness.     Comments: Gravid--fundal height appears AGA, Soft resting tone, NT   Musculoskeletal:        General: Normal range of motion.     Cervical back: Normal range of motion.  Skin:    General: Skin is warm and dry.  Neurological:     Mental Status: She is alert and oriented to person, place, and time.  Psychiatric:        Mood and Affect: Mood normal.        Behavior: Behavior normal.    FHT: 135 bpm, Mod Var, -Decels, +Accels Toco: Q1-15min, palpates moderate  A SIUP at 39.2 weeks Contractions GHTN Cat I FT  P -Discussed elevated blood  pressures and obtaining labs. -Informed that primary ob to be contacted for admission as patient meets criteria for GHTN. -Patient verbalizes understanding and without questions. -Nurse instructed to call primary for admission orders. -CBC, CMP, and PC Ratio ordered.   -NST Reactive  Gerrit Heck, CNM 07/05/2023 3:26 AM

## 2023-07-05 NOTE — Transfer of Care (Signed)
Immediate Anesthesia Transfer of Care Note  Patient: Cynthia Nolan  Procedure(s) Performed: CESAREAN SECTION  Patient Location: PACU  Anesthesia Type:Epidural  Level of Consciousness: awake  Airway & Oxygen Therapy: Patient Spontanous Breathing  Post-op Assessment: Report given to RN  Post vital signs: Reviewed and stable  Last Vitals:  Vitals Value Taken Time  BP 100/57 07/05/23 1100  Temp    Pulse 95 07/05/23 1101  Resp 14 07/05/23 1101  SpO2 96 % 07/05/23 1101  Vitals shown include unvalidated device data.  Last Pain:  Vitals:   07/05/23 0931  TempSrc:   PainSc: 0-No pain      Patients Stated Pain Goal: 0 (07/05/23 0421)  Complications: No notable events documented.

## 2023-07-05 NOTE — Progress Notes (Signed)
Complete/0 station. Practice pushes done with good effort, strip tolerating second stage thus far. Close eye on status, once again reviewed operative vaginal delivery. Augmentation not indicated at this time, TOCO q2-62m

## 2023-07-05 NOTE — MAU Note (Signed)
.  Cynthia Nolan is a 27 y.o. at [redacted]w[redacted]d here in MAU reporting ctxs since 0500 but closer for the last 2 hours. Denies LOF or VB. Reports good FM. 1cm last sve  Onset of complaint: 0500Thurs Pain score: 8 149/92 P-84  R-18 T-98.1    FHT:147 Lab orders placed from triage:  mau labor eval e

## 2023-07-05 NOTE — Anesthesia Preprocedure Evaluation (Signed)
Anesthesia Evaluation  Patient identified by MRN, date of birth, ID band Patient awake    Reviewed: Allergy & Precautions, NPO status , Patient's Chart, lab work & pertinent test results  Airway Mallampati: II  TM Distance: >3 FB Neck ROM: Full    Dental no notable dental hx. (+) Teeth Intact   Pulmonary neg pulmonary ROS   Pulmonary exam normal breath sounds clear to auscultation       Cardiovascular negative cardio ROS Normal cardiovascular exam Rhythm:Regular Rate:Normal  ? gHtn    Neuro/Psych negative neurological ROS     GI/Hepatic negative GI ROS, Neg liver ROS,,,  Endo/Other    Renal/GU negative Renal ROS     Musculoskeletal negative musculoskeletal ROS (+)    Abdominal  (+) + obese (BMI 35.6)  Peds  Hematology Lab Results      Component                Value               Date                      WBC                      16.6 (H)            07/05/2023                HGB                      10.3 (L)            07/05/2023                HCT                      33.5 (L)            07/05/2023                MCV                      76.7 (L)            07/05/2023                PLT                      222                 07/05/2023              Anesthesia Other Findings All: Amoxicillin  Reproductive/Obstetrics (+) Pregnancy                             Anesthesia Physical Anesthesia Plan  ASA: 3  Anesthesia Plan: Epidural   Post-op Pain Management:    Induction:   PONV Risk Score and Plan:   Airway Management Planned:   Additional Equipment:   Intra-op Plan:   Post-operative Plan:   Informed Consent: I have reviewed the patients History and Physical, chart, labs and discussed the procedure including the risks, benefits and alternatives for the proposed anesthesia with the patient or authorized representative who has indicated his/her understanding and acceptance.        Plan Discussed with:   Anesthesia Plan Comments: (39.2 wk Primagravida w ? gHtn and  BMI 35.6 for LEA)       Anesthesia Quick Evaluation

## 2023-07-05 NOTE — Op Note (Signed)
C-Section Operative Note  Date: 07/05/23  Preoperative Diagnosis: IUP @ 39 2/7, gestational hypertension  Postoperative Diagnosis: same as above Procedure: Primary low transverse cesarean section with T-extension Indications: Fetal intolerance to labor Findings: Viable female infant weighing 7lb1oz with APGARS of 7 and 8 at 1 and 5 minutes, respectively. Normal appearing uterus, bilateral fallopian tubes and ovaries. Thick meconium amniotic fluid Specimens: placenta to pathology EBL 830 IVF 1300cc UOP 100cc  Patient Course: Admitted in labor s/p SROM with light meconium. Cat 2 tracing at that time which resolved with IM terbutaline and repositioning. Received epidural and progressed quickly to complete - only intermittent variables at that time, IUPC in place with amnioinfusion. Began pushing with verbal consent obtained for VAVD if station allowed. However, despite adequate maternal effort, deepening variable decelerations noted despite position changes. Fetal station still 0 to +1. Consented for urgent PLTCS for fetal intolerance to labor  Consent:  R/B/A of cesarean section discussed with patient. Alternative would be vaginal delivery which would mean shorter postpartum stay and decreased risk of bleeding. Risks of section include infection of the uterus, pelvic organs, or skin, inadvertent injury to internal organs, such as bowel or bladder. If there is major injury, extensive surgery may be required. If injury is minor, it may be treated with relative ease. Discussed possibility of excessive blood loss and transfusion. If bleeding cannot be controlled using medical or minor surgical methods, a cesarean hysterectomy may be performed which would mean no future fertility. Patient accepts the possibility of blood transfusion, if necessary. Patient understands and agrees to move forward with section.   Operative Procedure: Patient was taken to the operating room where epidural anesthesia was found  to be adequate by Allis clamp test. She was prepped and draped in the normal sterile fashion in the dorsal supine position with a leftward tilt. An appropriate time out was performed. A Pfannenstiel skin incision was then made with the scalpel and carried through to the underlying layer of fascia by sharp dissection and Bovie cautery. The fascia was nicked in the midline and the incision was extended laterally with Mayo scissors. The superior aspect of the incision was grasped Coker clamps and dissected off the underlying rectus muscles. In a similar fashion the inferior aspect was dissected off the rectus muscles. Rectus muscles were separated in the midline and the peritoneal cavity entered bluntly. The peritoneal incision was then extended both superiorly and inferiorly with careful attention to avoid both bowel and bladder. The Alexis self-retaining wound retractor was then placed within the incision and the lower uterine segment exposed. The bladder flap was developed with Metzenbaum scissors and pushed away from the lower uterine segment. The lower uterine segment was then incised in a low transverse fashion and the cavity itself entered bluntly. The incision was extended bluntly. Amniotic sac was ruptured and fluid was noted to be thick meconium green in color. The infant's head was then lifted and attempts made to be delivered from the incision. Head delivered however difficulty delivering anterior shoulder with normal traction/maneuvers. Anterior shoulder axilla palpated and LUE (anterior) was noted to be flexed posterior behind fetal thorax. Attempted to sweep forward however tight myometrium noted. Bandage scissors used, in order to try to extend hysterotomy apiceds however redundant bowel noted despite prior packing with wet laps. Decision made to T-incise approx 2.5cm at midline. The remainder of the infant delivered and the nose and mouth bulb suctioned with the cord clamped and cut as well. Poor tone,  infant passed  to NICU, good cry was noted. The placenta was then spontaneously expressed from the uterus and the uterus cleared of all clots and debris with moist lap sponge. The uterine incision was then repaired in 2 layers the first layer was a running locked layer of 0-vicryl and the second an imbricating layer of the same suture. T-incision closed with 0-vicyrl in running locked fashion. The tubes and ovaries were inspected and the gutters cleared of all clots and debris. The uterine incision was inspected and found to be hemostatic. Arista placed over incision for hemostasis. All instruments and sponges as well as the Alexis retractor were then removed from the abdomen. The peritoneum was then reapproximated using 3-0 vicryl suture. The fascia was then closed with 0 Vicryl in a running fashion. Subcutaneous tissue was reapproximated with 3-0 plain in a running fashion. The skin was closed with a subcuticular stitch of 4-0 Vicryl on a Keith needle and then reinforced with Dermabond and Honeycomb. At the conclusion of the procedure all instruments and sponge counts were correct. Patient was taken to the recovery room in good condition with her baby accompanying her skin to skin.

## 2023-07-05 NOTE — Progress Notes (Addendum)
Getting comfortable s/p epidural, some pelvic pressure BP (!) 143/90   Pulse 79   Temp 97.9 F (36.6 C) (Oral)   Resp 18   Ht 5\' 5"  (1.651 m)   Wt 97.1 kg   SpO2 99%   BMI 35.61 kg/m  Anterior lip, IUPC placed.  Cat 2 tracing - mod var, variable decels for approx 75% Foley placed after epidural Tracing improves when patient sits upright. Close eye on status, very soft cervix, will recheck in 10-63m. Briefly reviewed indications for urgent PLTCS vs operative vaginal delivery, patient is amenable to vacuum-assisted vaginal delivery should it be necessary.   Amnioinfusion ordered, repeat uPC ordered

## 2023-07-05 NOTE — Anesthesia Procedure Notes (Signed)
Epidural Patient location during procedure: OB Start time: 07/05/2023 7:54 AM End time: 07/05/2023 8:09 AM  Staffing Anesthesiologist: Trevor Iha, MD Performed: anesthesiologist   Preanesthetic Checklist Completed: patient identified, IV checked, site marked, risks and benefits discussed, surgical consent, monitors and equipment checked, pre-op evaluation and timeout performed  Epidural Patient position: sitting Prep: DuraPrep and site prepped and draped Patient monitoring: continuous pulse ox and blood pressure Approach: midline Location: L2-L3 Injection technique: LOR air  Needle:  Needle type: Tuohy  Needle gauge: 17 G Needle length: 9 cm and 9 Needle insertion depth: 6 cm Catheter type: closed end flexible Catheter size: 19 Gauge Catheter at skin depth: 12 cm Test dose: negative  Assessment Events: blood not aspirated, no cerebrospinal fluid, injection not painful, no injection resistance, no paresthesia and negative IV test  Additional Notes Patient identified. Risks/Benefits/Options discussed with patient including but not limited to bleeding, infection, nerve damage, paralysis, failed block, incomplete pain control, headache, blood pressure changes, nausea, vomiting, reactions to medication both or allergic, itching and postpartum back pain. Confirmed with bedside nurse the patient's most recent platelet count. Confirmed with patient that they are not currently taking any anticoagulation, have any bleeding history or any family history of bleeding disorders. Patient expressed understanding and wished to proceed. All questions were answered. Sterile technique was used throughout the entire procedure. Please see nursing notes for vital signs. Test dose was given through epidural needle and negative prior to continuing to dose epidural or start infusion. Warning signs of high block given to the patient including shortness of breath, tingling/numbness in hands, complete motor  block, or any concerning symptoms with instructions to call for help. Patient was given instructions on fall risk and not to get out of bed. All questions and concerns addressed with instructions to call with any issues. 2 Attempt (S) . Patient tolerated procedure well.

## 2023-07-05 NOTE — Anesthesia Postprocedure Evaluation (Signed)
Anesthesia Post Note  Patient: Cynthia Nolan  Procedure(s) Performed: CESAREAN SECTION     Patient location during evaluation: Mother Baby Anesthesia Type: Epidural Level of consciousness: oriented and awake and alert Pain management: pain level controlled Vital Signs Assessment: post-procedure vital signs reviewed and stable Respiratory status: spontaneous breathing and respiratory function stable Cardiovascular status: blood pressure returned to baseline and stable Postop Assessment: no headache, no backache, no apparent nausea or vomiting and able to ambulate Anesthetic complications: no   No notable events documented.  Last Vitals:  Vitals:   07/05/23 1210 07/05/23 1304  BP: 123/80 126/78  Pulse: 88 82  Resp: 18 18  Temp: 36.8 C 36.7 C  SpO2:      Last Pain:  Vitals:   07/05/23 1304  TempSrc: Oral  PainSc:    Pain Goal: Patients Stated Pain Goal: 0 (07/05/23 0421)                 Trevor Iha

## 2023-07-05 NOTE — Lactation Note (Signed)
This note was copied from a baby's chart. Lactation Consultation Note  Patient Name: Cynthia Nolan Date: 07/05/2023 Age:25 hours Reason for consult: Initial assessment;1st time breastfeeding;Primapara;Term (See Birth Parent- MR c/s delivery and CHTN in pregnancy.)  P1. Term female infant, Per Birth Parent, infant has been sleepy and not latching currently been past 3 hours since last feeding would like latch assistance. Birth Parent did reverse pressure softening to help evert nipple shaft our more, infant briefly latch on and off the breast for 5 minutes, afterwards Birth Parent had expressed, she already knew how to hand express she did expression prenatally and was leaking colostrum at [redacted] weeks gestation. Birth Parent easily expressed 8 mls that was spoon fed to infant. Infant had small emesis and mucous in nose was suction by LC. Birth Parent was given hand pump with 21 mm breast flange to help evert nipple shaft out more prior to latching infant at the breast. LC discussed infant's input and output. Importance of maternal rest, diet and hydration. When demonstrating how to use hand pump Birth Parent easily expressed 3 mls of colostrum. LC gave hand out " Storage and Preparation of Breast Milk" and Birth Parent knows that EBM is safe for 4 hours at room temperature.   Current Feeding plan: 1- Birth Parent will pre-pump breast with hand pump prior to latching infant at the breast. Will continue to BF infant by cues, on demand, 8 to 12+ times within 24 hours, skin to skin. 2- Birth Parent knows to call RN/LC for further latch assistance if needed. 3- Birth Parent knows to hand express if infant doesn't latch and give back EBM as she continue to work on infant learning how to latch at the breast.   Maternal Data Has patient been taught Hand Expression?: Yes Does the patient have breastfeeding experience prior to this delivery?: No  Feeding Mother's Current Feeding Choice: Breast  Milk  LATCH Score Latch: Repeated attempts needed to sustain latch, nipple held in mouth throughout feeding, stimulation needed to elicit sucking reflex.  Audible Swallowing: A few with stimulation  Type of Nipple: Flat  Comfort (Breast/Nipple): Soft / non-tender  Hold (Positioning): Assistance needed to correctly position infant at breast and maintain latch.  LATCH Score: 6   Lactation Tools Discussed/Used Tools: Pump;Flanges Flange Size: 21 Breast pump type: Manual Pump Education: Setup, frequency, and cleaning;Milk Storage Reason for Pumping: Pre-pump prior to latching infant at the breast due to having flat nipples. Pumping frequency: Pre-pump prior to latching infant at the breast. Pumped volume: 8 mL  Interventions Interventions: Breast feeding basics reviewed;Adjust position;Support pillows;Assisted with latch;Skin to skin;Position options;Breast massage;Expressed milk;Education;Hand express;Breast compression;Reverse pressure;Hand pump;LC Services brochure  Discharge Pump: Personal;DEBP  Consult Status Consult Status: Follow-up Date: 07/06/23 Follow-up type: In-patient    Cynthia Nolan 07/05/2023, 5:14 PM

## 2023-07-05 NOTE — H&P (Signed)
Cynthia Nolan is a 27 y.o. female presenting for evaluation of contractions. CE 3-4cm on arrival, made change to approx 6cm at which point SROM (light mec) occurred. No epidural currently. Elevated BP in MAU, denies PreE symptoms. Meets GHTN criteria.   GBS pos (low reaction to amox, tolerating Cefazolin for GBS ppx). PNC c/b anemia (Hgb 10.7 @ 28wks).   At shift change, when SROM occurred, Cat 2 tracing remote from delivery (6-7cm) with recurrent variables, min var, TOCO q1-48m. IM terb given, Currently cat 1 tracing OB History     Gravida  1   Para      Term      Preterm      AB      Living         SAB      IAB      Ectopic      Multiple      Live Births             Past Medical History:  Diagnosis Date   Medical history non-contributory    Past Surgical History:  Procedure Laterality Date   WISDOM TOOTH EXTRACTION     Family History: family history includes Diabetes in her sister. Social History:  reports that she has never smoked. She has never used smokeless tobacco. She reports current alcohol use. She reports that she does not use drugs.     Maternal Diabetes: No1hr 136, 3hr 0 of 4 Genetic Screening: Declined Maternal Ultrasounds/Referrals: Normal Fetal Ultrasounds or other Referrals:  None Maternal Substance Abuse:  No Significant Maternal Medications:  None Significant Maternal Lab Results:  Group B Strep positive Number of Prenatal Visits:greater than 3 verified prenatal visits Other Comments:  None  Review of Systems History Dilation: 7 Effacement (%): 90 Station: -1 Exam by:: Dr Tenny Craw Blood pressure (!) 143/90, pulse 79, temperature 97.9 F (36.6 C), temperature source Oral, resp. rate 18, height 5\' 5"  (1.651 m), weight 97.1 kg, SpO2 99 %. Exam Physical Exam Constitutional:      General: She is not in acute distress.    Appearance: She is well-developed.  HENT:     Head: Normocephalic and atraumatic.  Eyes:     Pupils: Pupils are  equal, round, and reactive to light.  Cardiovascular:     Rate and Rhythm: Normal rate and regular rhythm.     Heart sounds: No murmur heard.    No gallop.  Abdominal:     Tenderness: There is no abdominal tenderness. There is no guarding or rebound.  Genitourinary:    Vagina: Normal.  Musculoskeletal:        General: Normal range of motion.     Cervical back: Normal range of motion and neck supple.  Skin:    General: Skin is warm and dry.  Neurological:     Mental Status: She is alert and oriented to person, place, and time.     Prenatal labs: ABO, Rh: --/--/A POS (07/05 1610) Antibody: NEG (07/05 0412) Rubella:  imm RPR:   nr HBsAg:   neg HIV:   neg GBS: Positive/-- (06/12 0000)   Assessment/Plan: This is a 27yo G1 @ 39 2/7 by LMP c/w 9wk scan admitted in labor 1) Labor: active currently, s/p IM terb for cat 2 tracing remote from delivery at shift change. Cat 2 currently, TOCO q2-5    -Discussion re analgesia, pt amenable to epidural at this time    -SROM @ 0550, light mec, close eye on tracing    -  Augment as needed 2) GBS pos - amox allergy with low criticality    -currently tolerating IV Cefazolin, continue order 3) Elevated BP on admission    -Meets GHTN criteria    -Labs: 10.3/33.5/222, AST/ALT 27/16, Cr 0.75, uPC 3.88       *Unclear if uPC resulted with bloody show, will repeat lab after epidural once Foley placed in order to confirm GHTN vs PreE w/o SF   Charlye Spare M Aden Youngman 07/05/2023, 7:32 AM

## 2023-07-05 NOTE — Progress Notes (Signed)
Chart review: normotensive since delivery, repeat uPC never done. Plan for postpartum BP check for GHTN vs PreE w/o SF. Routine postop care, declines circumcision BP 122/72 (BP Location: Right Arm)   Pulse 82   Temp 98 F (36.7 C) (Oral)   Resp 18   Ht 5\' 5"  (1.651 m)   Wt 97.1 kg   SpO2 95%   Breastfeeding Unknown   BMI 35.61 kg/m

## 2023-07-06 LAB — CBC
HCT: 25.6 % — ABNORMAL LOW (ref 36.0–46.0)
Hemoglobin: 8 g/dL — ABNORMAL LOW (ref 12.0–15.0)
MCH: 24.2 pg — ABNORMAL LOW (ref 26.0–34.0)
MCHC: 31.3 g/dL (ref 30.0–36.0)
MCV: 77.3 fL — ABNORMAL LOW (ref 80.0–100.0)
Platelets: 192 10*3/uL (ref 150–400)
RBC: 3.31 MIL/uL — ABNORMAL LOW (ref 3.87–5.11)
RDW: 15.9 % — ABNORMAL HIGH (ref 11.5–15.5)
WBC: 19.6 10*3/uL — ABNORMAL HIGH (ref 4.0–10.5)
nRBC: 0 % (ref 0.0–0.2)

## 2023-07-06 NOTE — Progress Notes (Signed)
Subjective: Postpartum Day 1: Cesarean Delivery Patient reports tolerating PO, + flatus, and no problems voiding.   Pain mostly on right of incision but well controlled with meds. She denies lightheadedness, HA, SOP or CP. She is pumping well and spoonfeeding baby. Has no complaints this am  Objective: Vital signs in last 24 hours: Temp:  [97.4 F (36.3 C)-98.6 F (37 C)] 98 F (36.7 C) (07/06 0550) Pulse Rate:  [81-104] 83 (07/06 0550) Resp:  [15-20] 18 (07/06 0550) BP: (79-126)/(57-86) 119/86 (07/06 0550) SpO2:  [94 %-98 %] 98 % (07/06 0550)  Physical Exam:  General: alert, cooperative, and no distress Lochia: appropriate Uterine Fundus: firm Incision: no significant drainage DVT Evaluation: No evidence of DVT seen on physical exam.  Recent Labs    07/05/23 0410 07/06/23 0512  HGB 10.3* 8.0*  HCT 33.5* 25.6*    Assessment/Plan: Status post Cesarean section. Doing well postoperatively.  Continue current care. Does not desire circumcision for baby BP stable   Cathrine Muster, DO 07/06/2023, 9:23 AM

## 2023-07-06 NOTE — Lactation Note (Signed)
This note was copied from a baby's chart. Lactation Consultation Note  Patient Name: Cynthia Nolan [id number] Date: 07/06/2023 Age:25 hours Reason for consult: Follow-up assessment;1st time breastfeeding;Primapara;Term;Infant weight loss (5 % weight loss,Mom woke up briefly from a nap and expressed she desired to go back to sleep. Dad just started spoon feeding.) LC reviewed the doc flow sheets has had BF attempts, spoon feeding and a BF range of 10 -30 mins.  Output WNL for age.  Maternal Data Has patient been taught Hand Expression?: Yes  Feeding Mother's Current Feeding Choice: Breast Milk  LATCH Score 4-6   Lactation Tools Discussed/Used Tools: Pump;Flanges Flange Size: 21 Breast pump type: Manual  Interventions Interventions: Hand pump  Discharge Pump: Personal;DEBP  Consult Status Consult Status: Follow-up Date: 07/07/23 Follow-up type: In-patient    Cynthia Nolan 07/06/2023, 2:41 PM

## 2023-07-07 MED ORDER — FERROUS SULFATE 325 (65 FE) MG PO TABS
325.0000 mg | ORAL_TABLET | Freq: Every day | ORAL | Status: DC
  Administered 2023-07-07 – 2023-07-08 (×2): 325 mg via ORAL
  Filled 2023-07-07 (×2): qty 1

## 2023-07-07 MED ORDER — NIFEDIPINE ER OSMOTIC RELEASE 30 MG PO TB24
30.0000 mg | ORAL_TABLET | Freq: Every day | ORAL | Status: DC
  Administered 2023-07-07: 30 mg via ORAL
  Filled 2023-07-07: qty 1

## 2023-07-07 NOTE — Progress Notes (Signed)
Subjective: Postpartum Day 2: Cesarean Delivery Patient is doing well this morning. Pain is controlled. Ambulating, voiding, tolerating PO. Minimal lochia. Breastfeeding.   Objective: Patient Vitals for the past 24 hrs:  BP Temp Temp src Pulse Resp SpO2  07/07/23 0630 125/81 98 F (36.7 C) Oral 85 17 100 %  07/06/23 2105 133/83 97.6 F (36.4 C) Oral 94 17 100 %  07/06/23 1336 117/73 98 F (36.7 C) Oral 77 18 --    Physical Exam:  General: alert, cooperative, and no distress Lochia: appropriate Uterine Fundus: firm Incision: healing well, no significant drainage, no dehiscence, no significant erythema DVT Evaluation: No evidence of DVT seen on physical exam.  Recent Labs    07/05/23 0410 07/06/23 0512  HGB 10.3* 8.0*  HCT 33.5* 25.6*    Assessment/Plan: Cynthia Nolan G1P1001 POD#2 sp primary cesarean at [redacted]w[redacted]d for fetal intolerance to labor 1. PPC: routine PP care 2. ABLA: asymptomatic, PO iron 3. Rh pos 4. Mild range Bps during labor: has been normotensive since 5. Dispo:  plan discharge home tomorrow  Cynthia Nolan 07/07/2023, 8:02 AM

## 2023-07-08 ENCOUNTER — Other Ambulatory Visit (HOSPITAL_BASED_OUTPATIENT_CLINIC_OR_DEPARTMENT_OTHER): Payer: Self-pay

## 2023-07-08 LAB — SURGICAL PATHOLOGY

## 2023-07-08 MED ORDER — OXYCODONE HCL 5 MG PO TABS
5.0000 mg | ORAL_TABLET | Freq: Four times a day (QID) | ORAL | 0 refills | Status: DC | PRN
  Filled 2023-07-08: qty 20, 5d supply, fill #0

## 2023-07-08 MED ORDER — IBUPROFEN 600 MG PO TABS
600.0000 mg | ORAL_TABLET | Freq: Four times a day (QID) | ORAL | 0 refills | Status: DC
  Filled 2023-07-08: qty 60, 15d supply, fill #0

## 2023-07-08 MED ORDER — NIFEDIPINE ER OSMOTIC RELEASE 30 MG PO TB24
30.0000 mg | ORAL_TABLET | Freq: Once | ORAL | Status: AC
  Administered 2023-07-08: 30 mg via ORAL
  Filled 2023-07-08: qty 1

## 2023-07-08 MED ORDER — NIFEDIPINE ER OSMOTIC RELEASE 30 MG PO TB24: 60.0000 mg | ORAL_TABLET | Freq: Every day | ORAL | Status: DC

## 2023-07-08 MED ORDER — NIFEDIPINE ER 60 MG PO TB24
60.0000 mg | ORAL_TABLET | Freq: Every day | ORAL | 1 refills | Status: DC
  Filled 2023-07-08: qty 30, 30d supply, fill #0

## 2023-07-08 NOTE — Progress Notes (Signed)
Subjective: Postpartum Day 3: Cesarean Delivery Patient is doing well this morning. Pain is controlled. Ambulating, voiding, tolerating PO. Minimal lochia. Breastfeeding.   Objective: Patient Vitals for the past 24 hrs:  BP Temp Temp src Pulse Resp SpO2  07/08/23 0914 (!) 136/92 -- -- 92 -- --  07/08/23 0530 (!) 140/93 98.3 F (36.8 C) Oral (!) 108 17 99 %  07/07/23 2230 (!) 145/94 -- -- (!) 101 -- --  07/07/23 2104 (!) 133/93 99.1 F (37.3 C) Oral (!) 104 20 --  07/07/23 1352 (!) 141/93 98.4 F (36.9 C) Oral 94 18 --    Physical Exam:  General: alert, cooperative, and no distress Lochia: appropriate Uterine Fundus: firm Incision: healing well, no significant drainage, no dehiscence, no significant erythema DVT Evaluation: No evidence of DVT seen on physical exam. 1+ edema bilaterally  Recent Labs    07/06/23 0512  HGB 8.0*  HCT 25.6*    Assessment/Plan: Cynthia Nolan G1P1001 POD#3 sp primary cesarean at [redacted]w[redacted]d for fetal intolerance to labor 1. PPC: routine PP care 2. ABLA: asymptomatic, PO iron 3. Rh pos 4. GHTN: started on procardia XL 30mg  daily yesterday, second dose added this AM.   Now with mildly elevated diastolic BPs.  Does desire discharge today. Will monitor closely until early afternoon. If BPs remain stable, will discharge home with close interval BP check in office.  If trending up, will require inpatient management until tomorrow 5. Dispo:  plan discharge home tomorrow  [facility] GEFFEL Cynthia Nolan 07/08/2023, 9:58 AM

## 2023-07-08 NOTE — Discharge Summary (Signed)
Postpartum Discharge Summary  Date of Service updated 07/08/23     Patient Name: Cynthia Nolan DOB: [birth year] MRN: [id number]  Date of admission: 07/05/2023 Delivery date:07/05/2023  Delivering provider: Carlisle Cater  Date of discharge: 07/08/2023  Admitting diagnosis: Indication for care in labor and delivery, antepartum [O75.9] Intrauterine pregnancy: [redacted]w[redacted]d     Secondary diagnosis:  Principal Problem:   Indication for care in labor and delivery, antepartum  Additional problems: GHTN    Discharge diagnosis: Term Pregnancy Delivered and Gestational Hypertension                                              Post partum procedures: n/a Augmentation: N/A Complications: None  Hospital course: Onset of Labor With Unplanned C/S   27 y.o. yo G1P1001 at [redacted]w[redacted]d was admitted in Active Labor on 07/05/2023. Patient had a labor course significant for recurrent category 2 fetal tracing. The patient went for cesarean section due to Non-Reassuring FHR. Delivery details as follows: Membrane Rupture Time/Date: 4:50 AM ,07/05/2023   Delivery Method:C-Section, Low Transverse  Details of operation can be found in separate operative note. Patient had a postpartum course complicated by elevated BPs postpartum.  She was started on nifedipine Xl and titrated to 60mg  daily on the day of discharge.  She is ambulating,tolerating a regular diet, passing flatus, and urinating well.  Patient is discharged home in stable condition 07/08/23.  Newborn Data: Birth date:07/05/2023  Birth time:10:02 AM  Gender:Female  Living status:Living  Apgars:7 ,8  Weight:3210 g      Physical exam  Vitals:   07/08/23 1117 07/08/23 1225 07/08/23 1415 07/08/23 1548  BP: (!) 154/98 131/77 134/81 134/87  Pulse: 99 85 86 96  Resp:      Temp:      TempSrc:      SpO2:      Weight:      Height:       General: alert, cooperative, and no distress Lochia: appropriate Uterine Fundus: firm Incision: Healing well with no  significant drainage DVT Evaluation: No evidence of DVT seen on physical exam. Labs: Lab Results  Component Value Date   WBC 19.6 (H) 07/06/2023   HGB 8.0 (L) 07/06/2023   HCT 25.6 (L) 07/06/2023   MCV 77.3 (L) 07/06/2023   PLT 192 07/06/2023      Latest Ref Rng & Units 07/05/2023    4:10 AM  CMP  Glucose 70 - 99 mg/dL 409   BUN 6 - 20 mg/dL 6   Creatinine 8.11 - 9.14 mg/dL 7.82   Sodium 956 - 213 mmol/L 135   Potassium 3.5 - 5.1 mmol/L 3.6   Chloride 98 - 111 mmol/L 106   CO2 22 - 32 mmol/L 17   Calcium 8.9 - 10.3 mg/dL 9.1   Total Protein 6.5 - 8.1 g/dL 6.4   Total Bilirubin 0.3 - 1.2 mg/dL 0.1   Alkaline Phos 38 - 126 U/L 132   AST 15 - 41 U/L 27   ALT 0 - 44 U/L 16    Edinburgh Score:    07/06/2023   12:33 AM  Edinburgh Postnatal Depression Scale Screening Tool  I have been able to laugh and see the funny side of things. 0  I have looked forward with enjoyment to things. 0  I have blamed myself unnecessarily when things went wrong.  0  I have been anxious or worried for no good reason. 2  I have felt scared or panicky for no good reason. 0  Things have been getting on top of me. 0  I have been so unhappy that I have had difficulty sleeping. 0  I have felt sad or miserable. 0  I have been so unhappy that I have been crying. 0  The thought of harming myself has occurred to me. 0  Edinburgh Postnatal Depression Scale Total 2      After visit meds:  Allergies as of 07/08/2023       Reactions   Amoxil [amoxicillin] Rash        Medication List     TAKE these medications    ferrous sulfate 325 (65 FE) MG tablet Take 1 tablet (325 mg total) by mouth daily.   ibuprofen 600 MG tablet Commonly known as: ADVIL Take 1 tablet (600 mg total) by mouth every 6 (six) hours.   NIFEdipine 60 MG 24 hr tablet Commonly known as: ADALAT CC Take 1 tablet (60 mg total) by mouth at bedtime.   oxyCODONE 5 MG immediate release tablet Commonly known as: Oxy  IR/ROXICODONE Take 1 tablet (5 mg total) by mouth every 6 (six) hours as needed for severe pain.   prenatal multivitamin Tabs tablet Take 1 tablet by mouth daily.         Discharge home in stable condition Infant Feeding: Breast Infant Disposition:home with mother Discharge instruction: per After Visit Summary and Postpartum booklet. Activity: Advance as tolerated. Pelvic rest for 6 weeks.  Diet: routine diet Anticipated Birth Control: Unsure Postpartum Appointment:6 weeks Additional Postpartum F/U: BP check 1 week Future Appointments:No future appointments. Follow up Visit:  Follow-up Information     [facility] Follow up in 5 day(s).   Why: BP check Contact information: [address]  [address] [place] [address] [phone]                     07/08/2023 [facility] GEFFEL Chestine Spore, MD

## 2023-07-08 NOTE — Lactation Note (Signed)
This note was copied from a baby's chart. Lactation Consultation Note  Patient Name: Cynthia Nolan [id number] Date: 07/08/2023 Age:25 hours Reason for consult: Follow-up assessment;1st time breastfeeding;Primapara;Term;Infant weight loss (5 % weight loss , baby's on the D/C list, recheck for moms B/P  before mom can be D/C) Milk is in,  Latch scores have been 7-8 and per mom had hand expressed and giving her nipples a break . LC discussed if the breast are to full to hand express or pre - pump off the fullness and reverse pressure.  LC reviewed  importance of baby having practice at the breast.  LC reviewed BF D/C teaching and the [facility] resources.   Maternal Data Has patient been taught Hand Expression?: Yes (moms milk is in , hand expressed off over 50 ml)  Feeding Mother's Current Feeding Choice: Breast Milk Nipple Type: Slow - flow  LATCH Score - Latch score 7-8    Lactation Tools Discussed/Used Tools: Pump;Flanges Flange Size: 21 Breast pump type: Manual Pump Education: Milk Storage  Interventions Interventions: Breast feeding basics reviewed;Education;Hand pump;LC Services brochure  Discharge Discharge Education: Engorgement and breast care;Warning signs for feeding baby Pump: Personal;Manual;DEBP  Consult Status Consult Status: Complete Date: 07/08/23    Kathrin Greathouse 07/08/2023, 9:52 AM

## 2023-07-09 ENCOUNTER — Other Ambulatory Visit (HOSPITAL_BASED_OUTPATIENT_CLINIC_OR_DEPARTMENT_OTHER): Payer: Self-pay

## 2023-07-30 ENCOUNTER — Telehealth (HOSPITAL_COMMUNITY): Payer: Self-pay | Admitting: *Deleted

## 2023-07-30 NOTE — Telephone Encounter (Signed)
07/30/2023  Name: NAVEH CRUMRINE MRN: [id number] DOB: [birth year]  Reason for Call:  Transition of Care Hospital Discharge Call  Contact Status: Patient Contact Status: Message  Language assistant needed: Interpreter Mode: Interpreter Not Needed        Follow-Up Questions:    Inocente Salles Postnatal Depression Scale:  In the Past 7 Days:    PHQ2-9 Depression Scale:     Discharge Follow-up:    Post-discharge interventions: NA  Salena Saner, RN 07/30/2023 13:37

## 2023-08-14 DIAGNOSIS — R87615 Unsatisfactory cytologic smear of cervix: Secondary | ICD-10-CM | POA: Diagnosis not present

## 2023-08-14 DIAGNOSIS — Z0142 Encounter for cervical smear to confirm findings of recent normal smear following initial abnormal smear: Secondary | ICD-10-CM | POA: Diagnosis not present

## 2023-08-14 DIAGNOSIS — Z3009 Encounter for other general counseling and advice on contraception: Secondary | ICD-10-CM | POA: Diagnosis not present

## 2023-08-14 DIAGNOSIS — Z1272 Encounter for screening for malignant neoplasm of vagina: Secondary | ICD-10-CM | POA: Diagnosis not present

## 2023-08-14 DIAGNOSIS — Z1151 Encounter for screening for human papillomavirus (HPV): Secondary | ICD-10-CM | POA: Diagnosis not present

## 2023-08-14 DIAGNOSIS — Z124 Encounter for screening for malignant neoplasm of cervix: Secondary | ICD-10-CM | POA: Diagnosis not present

## 2023-08-14 DIAGNOSIS — Z1389 Encounter for screening for other disorder: Secondary | ICD-10-CM | POA: Diagnosis not present

## 2023-09-17 ENCOUNTER — Telehealth: Payer: Commercial Managed Care - PPO | Admitting: Family Medicine

## 2023-09-17 ENCOUNTER — Encounter: Payer: Self-pay | Admitting: Nurse Practitioner

## 2023-09-17 ENCOUNTER — Telehealth: Payer: Commercial Managed Care - PPO

## 2023-09-17 DIAGNOSIS — N644 Mastodynia: Secondary | ICD-10-CM | POA: Diagnosis not present

## 2023-09-17 NOTE — Progress Notes (Signed)
No show for Video Visit

## 2023-09-17 NOTE — Patient Instructions (Signed)
Call your OBGYN for an appt and follow up.

## 2023-09-17 NOTE — Progress Notes (Signed)
Virtual Visit Consent   Cynthia Nolan, you are scheduled for a virtual visit with a  provider today. Just as with appointments in the office, your consent must be obtained to participate. Your consent will be active for this visit and any virtual visit you may have with one of our providers in the next 365 days. If you have a MyChart account, a copy of this consent can be sent to you electronically.  As this is a virtual visit, video technology does not allow for your provider to perform a traditional examination. This may limit your provider's ability to fully assess your condition. If your provider identifies any concerns that need to be evaluated in person or the need to arrange testing (such as labs, EKG, etc.), we will make arrangements to do so. Although advances in technology are sophisticated, we cannot ensure that it will always work on either your end or our end. If the connection with a video visit is poor, the visit may have to be switched to a telephone visit. With either a video or telephone visit, we are not always able to ensure that we have a secure connection.  By engaging in this virtual visit, you consent to the provision of healthcare and authorize for your insurance to be billed (if applicable) for the services provided during this visit. Depending on your insurance coverage, you may receive a charge related to this service.  I need to obtain your verbal consent now. Are you willing to proceed with your visit today? Cynthia Nolan has provided verbal consent on 09/17/2023 for a virtual visit (video or telephone). Freddy Finner, NP  Date: 09/17/2023 10:10 AM  Virtual Visit via Video Note   I, Freddy Finner, connected with  Cynthia Nolan  ([id number], May 24, 1996) on 09/17/23 at 10:15 AM EDT by a video-enabled telemedicine application and verified that I am speaking with the correct person using two identifiers.  Location: Patient: Virtual Visit Location Patient:  Home Provider: Virtual Visit Location Provider: Home Office   I discussed the limitations of evaluation and management by telemedicine and the availability of in person appointments. The patient expressed understanding and agreed to proceed.    History of Present Illness: Cynthia Nolan is a 27 y.o. who identifies as a female who was assigned female at birth, and is being seen today for mastitis  Slight fever (just feels like it- has not taken temp), achy, and right breast is red on the inside, and tender to the touch. She is pumping and nursing.   Pumping- usually get 4 oz per time and milk as dropped.    10 weeks post partum and has not reached out to the OBGYN as of yet and has not been seen with lactation.   Has a known rash PCN allergy  Problems:  Patient Active Problem List   Diagnosis Date Noted   Indication for care in labor and delivery, antepartum 07/05/2023    Allergies:  Allergies  Allergen Reactions   Amoxil [Amoxicillin] Rash   Medications:  Current Outpatient Medications:    ferrous sulfate 325 (65 FE) MG tablet, Take 1 tablet (325 mg total) by mouth daily., Disp: 30 tablet, Rfl: 1   ibuprofen (ADVIL) 600 MG tablet, Take 1 tablet (600 mg total) by mouth every 6 (six) hours., Disp: 60 tablet, Rfl: 0   NIFEdipine (ADALAT CC) 60 MG 24 hr tablet, Take 1 tablet (60 mg total) by mouth at bedtime., Disp: 30 tablet, Rfl:  1   oxyCODONE (OXY IR/ROXICODONE) 5 MG immediate release tablet, Take 1 tablet (5 mg total) by mouth every 6 (six) hours as needed for severe pain., Disp: 20 tablet, Rfl: 0   Prenatal Vit-Fe Fumarate-FA (PRENATAL MULTIVITAMIN) TABS tablet, Take 1 tablet by mouth daily., Disp: , Rfl:   Observations/Objective: Patient is well-developed, well-nourished in no acute distress.  Resting comfortably  at home.  Head is normocephalic, atraumatic.  No labored breathing.  Speech is clear and coherent with logical content.  Patient is alert and oriented at baseline.   Limited ability to assess breast in this setting  Assessment and Plan:  1. Breast pain, right  Given allergy, high milk production and only 10 weeks out from delivery it is recommended to follow up in person and see if a lactation consult can be scheduled to help with this and prevent future ones.   Advised to consult her OBGYN   Follow Up Instructions: I discussed the assessment and treatment plan with the patient. The patient was provided an opportunity to ask questions and all were answered. The patient agreed with the plan and demonstrated an understanding of the instructions.  A copy of instructions were sent to the patient via MyChart unless otherwise noted below.     The patient was advised to call back or seek an in-person evaluation if the symptoms worsen or if the condition fails to improve as anticipated.  Time:  I spent 3 minutes with the patient via telehealth technology discussing the above problems/concerns.    Freddy Finner, NP

## 2024-08-06 ENCOUNTER — Other Ambulatory Visit (HOSPITAL_COMMUNITY): Payer: Self-pay

## 2024-08-06 MED ORDER — PROGESTERONE 200 MG PO CAPS
200.0000 mg | ORAL_CAPSULE | Freq: Every day | ORAL | 0 refills | Status: DC
  Filled 2024-08-06: qty 30, 30d supply, fill #0

## 2024-08-07 ENCOUNTER — Other Ambulatory Visit (HOSPITAL_COMMUNITY): Payer: Self-pay

## 2024-08-08 ENCOUNTER — Other Ambulatory Visit (HOSPITAL_COMMUNITY): Payer: Self-pay

## 2024-08-08 MED ORDER — PROGESTERONE 200 MG PO CAPS: 200.0000 mg | ORAL_CAPSULE | Freq: Every day | ORAL | 1 refills | Status: DC

## 2024-08-10 ENCOUNTER — Inpatient Hospital Stay (HOSPITAL_COMMUNITY)

## 2024-08-10 ENCOUNTER — Inpatient Hospital Stay (HOSPITAL_COMMUNITY)
Admission: AD | Admit: 2024-08-10 | Discharge: 2024-08-10 | Disposition: A | Discharge status: Home / Self Care | Attending: Obstetrics & Gynecology | Admitting: Obstetrics & Gynecology

## 2024-08-10 ENCOUNTER — Other Ambulatory Visit (HOSPITAL_COMMUNITY): Payer: Self-pay

## 2024-08-10 ENCOUNTER — Other Ambulatory Visit: Payer: Self-pay

## 2024-08-10 ENCOUNTER — Encounter (HOSPITAL_COMMUNITY): Payer: Self-pay | Admitting: Obstetrics and Gynecology

## 2024-08-10 DIAGNOSIS — Z3A01 Less than 8 weeks gestation of pregnancy: Secondary | ICD-10-CM | POA: Diagnosis not present

## 2024-08-10 DIAGNOSIS — R1030 Lower abdominal pain, unspecified: Secondary | ICD-10-CM | POA: Diagnosis present

## 2024-08-10 DIAGNOSIS — F411 Generalized anxiety disorder: Secondary | ICD-10-CM | POA: Diagnosis not present

## 2024-08-10 DIAGNOSIS — O209 Hemorrhage in early pregnancy, unspecified: Secondary | ICD-10-CM

## 2024-08-10 DIAGNOSIS — O99341 Other mental disorders complicating pregnancy, first trimester: Secondary | ICD-10-CM | POA: Diagnosis not present

## 2024-08-10 DIAGNOSIS — Z113 Encounter for screening for infections with a predominantly sexual mode of transmission: Secondary | ICD-10-CM | POA: Diagnosis present

## 2024-08-10 LAB — WET PREP, GENITAL
Clue Cells Wet Prep HPF POC: NONE SEEN
Sperm: NONE SEEN
Trich, Wet Prep: NONE SEEN
WBC, Wet Prep HPF POC: >=10 — AB (ref <10)
Yeast Wet Prep HPF POC: NONE SEEN

## 2024-08-10 LAB — ABO/RH: ABO/RH(D): A POS

## 2024-08-10 LAB — CBC
HCT: 41.4 % (ref 36.0–46.0)
Hemoglobin: 13.1 g/dL (ref 12.0–15.0)
MCH: 25.6 pg — ABNORMAL LOW (ref 26.0–34.0)
MCHC: 31.6 g/dL (ref 30.0–36.0)
MCV: 81 fL (ref 80.0–100.0)
Platelets: 313 K/uL (ref 150–400)
RBC: 5.11 MIL/uL (ref 3.87–5.11)
RDW: 14 % (ref 11.5–15.5)
WBC: 11.6 K/uL — ABNORMAL HIGH (ref 4.0–10.5)
nRBC: 0 % (ref 0.0–0.2)

## 2024-08-10 LAB — URINALYSIS, ROUTINE W REFLEX MICROSCOPIC
Bacteria, UA: NONE SEEN
Bilirubin Urine: NEGATIVE
Glucose, UA: NEGATIVE mg/dL
Ketones, ur: NEGATIVE mg/dL
Leukocytes,Ua: NEGATIVE
Nitrite: NEGATIVE
Protein, ur: NEGATIVE mg/dL
Specific Gravity, Urine: 1.013 (ref 1.005–1.030)
pH: 5 (ref 5.0–8.0)

## 2024-08-10 LAB — POCT PREGNANCY, URINE: Preg Test, Ur: POSITIVE — AB

## 2024-08-10 LAB — HCG, QUANTITATIVE, PREGNANCY: hCG, Beta Chain, Quant, S: 52801 m[IU]/mL — ABNORMAL HIGH (ref <5)

## 2024-08-10 MED ORDER — HYDROXYZINE HCL 25 MG PO TABS: 25.0000 mg | ORAL_TABLET | Freq: Four times a day (QID) | ORAL | 0 refills | Status: AC | PRN

## 2024-08-10 NOTE — MAU Note (Signed)
 Cynthia Nolan is a 28 y.o. at Unknown here in MAU reporting: she's having lower abdominal cramping for the past 2 weeks and intermittent spotting  with wiping since Tuesday.  LMP: 06/21/2024 Onset of complaint: 2 weeks Pain score: 4 Vitals:   08/10/24 1858  BP: (!) 155/91  Pulse: (!) 102  Resp: 18  Temp: 98.7 F (37.1 C)  SpO2: 100%     FHT: NA  Lab orders placed from triage: UPT & UA

## 2024-08-10 NOTE — MAU Provider Note (Signed)
 Chief Complaint: Abdominal Pain and Vaginal Bleeding   None     SUBJECTIVE HPI: Cynthia Nolan is a 28 y.o. G2P1001 at [redacted]w[redacted]d by {Ob dating:14516} who presents to maternity admissions reporting ***.  She denies vaginal bleeding, vaginal itching/burning, urinary symptoms, h/a, dizziness, n/v, or fever/chills.    HPI  Past Medical History:  Diagnosis Date   Medical history non-contributory    Past Surgical History:  Procedure Laterality Date   CESAREAN SECTION N/A 07/05/2023   Procedure: CESAREAN SECTION;  Surgeon: Sudie Lavonia HERO, MD;  Location: [facility] LD [facility]S;  Service: Obstetrics;  Laterality: N/A;   WISDOM TOOTH EXTRACTION     Social History   Socioeconomic History   Marital status: Married    Spouse name: Not on file   Number of children: Not on file   Years of education: Not on file   Highest education level: Not on file  Occupational History   Not on file  Tobacco Use   Smoking status: Never   Smokeless tobacco: Never  Vaping Use   Vaping status: Former  Substance and Sexual Activity   Alcohol use: Yes    Comment: occasional   Drug use: Never   Sexual activity: Yes  Other Topics Concern   Not on file  Social History Narrative   Not on file   Social Drivers of Health   Financial Resource Strain: Not on file  Food Insecurity: No Food Insecurity (07/05/2023)   Hunger Vital Sign    Worried About Running Out of Food in the Last Year: Never true    Ran Out of Food in the Last Year: Never true  Transportation Needs: No Transportation Needs (07/05/2023)   PRAPARE - Administrator, Civil Service (Medical): No    Lack of Transportation (Non-Medical): No  Physical Activity: Not on file  Stress: Not on file  Social Connections: Unknown (05/15/2022)   Received from [facility]   Social Network    Social Network: Not on file  Intimate Partner Violence: Not At Risk (07/05/2023)   Humiliation, Afraid, Rape, and Kick questionnaire    Fear of Current or Ex-Partner:  No    Emotionally Abused: No    Physically Abused: No    Sexually Abused: No   No current facility-administered medications on file prior to encounter.   Current Outpatient Medications on File Prior to Encounter  Medication Sig Dispense Refill   ferrous sulfate  325 (65 FE) MG tablet Take 1 tablet (325 mg total) by mouth daily. 30 tablet 1   ibuprofen  (ADVIL ) 600 MG tablet Take 1 tablet (600 mg total) by mouth every 6 (six) hours. 60 tablet 0   NIFEdipine  (ADALAT  CC) 60 MG 24 hr tablet Take 1 tablet (60 mg total) by mouth at bedtime. 30 tablet 1   oxyCODONE  (OXY IR/ROXICODONE ) 5 MG immediate release tablet Take 1 tablet (5 mg total) by mouth every 6 (six) hours as needed for severe pain. 20 tablet 0   Prenatal Vit-Fe Fumarate-FA (PRENATAL MULTIVITAMIN) TABS tablet Take 1 tablet by mouth daily.     progesterone  (PROMETRIUM ) 200 MG capsule Take 1 capsule (200 mg total) by mouth daily. 30 capsule 1   Allergies  Allergen Reactions   Amoxil [Amoxicillin] Rash    ROS:  Pertinent positives/negatives listed above.  I have reviewed patient's Past Medical Hx, Surgical Hx, Family Hx, Social Hx, medications and allergies.   Physical Exam  Patient Vitals for the past 24 hrs:  BP Temp Temp src  Pulse Resp SpO2 Height Weight  08/10/24 1858 (!) 155/91 98.7 F (37.1 C) Oral (!) 102 18 100 % -- --  08/10/24 1854 -- -- -- -- -- -- 5' 5.75 (1.67 m) 95.8 kg   Constitutional: Well-developed, well-nourished female in no acute distress.  Cardiovascular: normal rate Respiratory: normal effort GI: Abd soft, non-tender. Pos BS x 4 MS: Extremities nontender, no edema, normal ROM Neurologic: Alert and oriented x 4.  GU: Neg CVAT.  PELVIC EXAM: Cervix pink, visually closed, without lesion, scant white creamy discharge, vaginal walls and external genitalia normal Bimanual exam: Cervix 0/long/high, firm, anterior, neg CMT, uterus nontender, nonenlarged, adnexa without tenderness, enlargement, or  mass  Doppler: ***  LAB RESULTS Results for orders placed or performed during the hospital encounter of 08/10/24 (from the past 24 hours)  Pregnancy, urine POC     Status: Abnormal   Collection Time: 08/10/24  6:39 PM  Result Value Ref Range   Preg Test, Ur POSITIVE (A) NEGATIVE  Urinalysis, Routine w reflex microscopic -Urine, Clean Catch     Status: Abnormal   Collection Time: 08/10/24  6:41 PM  Result Value Ref Range   Color, Urine YELLOW YELLOW   APPearance CLEAR CLEAR   Specific Gravity, Urine 1.013 1.005 - 1.030   pH 5.0 5.0 - 8.0   Glucose, UA NEGATIVE NEGATIVE mg/dL   Hgb urine dipstick MODERATE (A) NEGATIVE   Bilirubin Urine NEGATIVE NEGATIVE   Ketones, ur NEGATIVE NEGATIVE mg/dL   Protein, ur NEGATIVE NEGATIVE mg/dL   Nitrite NEGATIVE NEGATIVE   Leukocytes,Ua NEGATIVE NEGATIVE   RBC / HPF 0-5 0 - 5 RBC/hpf   WBC, UA 0-5 0 - 5 WBC/hpf   Bacteria, UA NONE SEEN NONE SEEN   Squamous Epithelial / HPF 0-5 0 - 5 /HPF   Mucus PRESENT   Wet prep, genital     Status: Abnormal   Collection Time: 08/10/24  8:09 PM   Specimen: PATH Cytology Cervicovaginal Ancillary Only  Result Value Ref Range   Yeast Wet Prep HPF POC NONE SEEN NONE SEEN   Trich, Wet Prep NONE SEEN NONE SEEN   Clue Cells Wet Prep HPF POC NONE SEEN NONE SEEN   WBC, Wet Prep HPF POC >=10 (A) <10   Sperm NONE SEEN   CBC     Status: Abnormal   Collection Time: 08/10/24  8:13 PM  Result Value Ref Range   WBC 11.6 (H) 4.0 - 10.5 K/uL   RBC 5.11 3.87 - 5.11 MIL/uL   Hemoglobin 13.1 12.0 - 15.0 g/dL   HCT 58.5 63.9 - 53.9 %   MCV 81.0 80.0 - 100.0 fL   MCH 25.6 (L) 26.0 - 34.0 pg   MCHC 31.6 30.0 - 36.0 g/dL   RDW 85.9 88.4 - 84.4 %   Platelets 313 150 - 400 K/uL   nRBC 0.0 0.0 - 0.2 %  ABO/Rh     Status: None   Collection Time: 08/10/24  8:18 PM  Result Value Ref Range   ABO/RH(D) A POS    No rh immune globuloin      NOT A RH IMMUNE GLOBULIN CANDIDATE, PT RH POSITIVE Performed at [facility]  Lab, [address] [place], [place] [address]     --/--/A POS (08/11 2018)  IMAGING US  OB Comp Less 14 Wks Result Date: 08/10/2024 CLINICAL DATA:  [id number] Vaginal bleeding 890711 EXAM: OBSTETRIC <14 WK ULTRASOUND TECHNIQUE: Transabdominal ultrasound was performed for evaluation of the gestation as well as  the maternal uterus and adnexal regions. COMPARISON:  None Available. FINDINGS: Intrauterine gestational sac: Single Yolk sac:  Present Fetal Pole:  Present Cardiac Activity: Present Heart Rate: 131 bpm CRL: 8.4 mm 6w 5d                US  EDC: 03/31/2025 Subchorionic hemorrhage:  None visualized. Maternal uterus/adnexae: Both ovaries appear within normal limits for patient's age. No free pelvic fluid. IMPRESSION: Single, live intrauterine gestation with an estimated gestational age of [redacted] weeks, 5 days. Fetal heart rate of 131 beats per minute. Continued routine obstetric and sonographic follow-up is recommended. Electronically Signed   By: Rogelia Myers M.D.   On: 08/10/2024 20:52    MAU Management/MDM: Orders Placed This Encounter  Procedures   Wet prep, genital   US  OB Comp Less 14 Wks   Urinalysis, Routine w reflex microscopic -Urine, Clean Catch   CBC   hCG, quantitative, pregnancy   Diet NPO time specified   Pregnancy, urine POC   ABO/Rh   Discharge patient    Meds ordered this encounter  Medications   hydrOXYzine  (ATARAX ) 25 MG tablet    Sig: Take 1 tablet (25 mg total) by mouth every 6 (six) hours as needed for anxiety.    Dispense:  90 tablet    Refill:  0    Consult ***.  Treatments in MAU included ***.  ASSESSMENT 1. Vaginal bleeding affecting early pregnancy   2. [redacted] weeks gestation of pregnancy   3. Generalized anxiety disorder     PLAN Discharge home with strict return precautions. Allergies as of 08/10/2024       Reactions   Amoxil [amoxicillin] Rash        Medication List     STOP taking these medications    ferrous sulfate  325 (65 FE) MG tablet    ibuprofen  600 MG tablet Commonly known as: ADVIL    NIFEdipine  60 MG 24 hr tablet Commonly known as: ADALAT  CC   oxyCODONE  5 MG immediate release tablet Commonly known as: Oxy IR/ROXICODONE        TAKE these medications    hydrOXYzine  25 MG tablet Commonly known as: ATARAX  Take 1 tablet (25 mg total) by mouth every 6 (six) hours as needed for anxiety.   prenatal multivitamin Tabs tablet Take 1 tablet by mouth daily.   progesterone  200 MG capsule Commonly known as: Prometrium  Take 1 capsule (200 mg total) by mouth daily.         Almarie Moats, MD OB Fellow 08/10/2024  9:18 PM

## 2024-08-11 LAB — GC/CHLAMYDIA PROBE AMP (~~LOC~~) NOT AT ARMC
Chlamydia: NEGATIVE
Comment: NEGATIVE
Comment: NORMAL
Neisseria Gonorrhea: NEGATIVE

## 2024-09-02 ENCOUNTER — Other Ambulatory Visit (HOSPITAL_COMMUNITY): Payer: Self-pay

## 2024-09-02 MED ORDER — MISOPROSTOL 200 MCG PO TABS
800.0000 ug | ORAL_TABLET | ORAL | 0 refills | Status: DC
  Filled 2024-09-02: qty 8, 2d supply, fill #0

## 2024-10-20 ENCOUNTER — Inpatient Hospital Stay (HOSPITAL_COMMUNITY)

## 2024-10-20 ENCOUNTER — Inpatient Hospital Stay (HOSPITAL_COMMUNITY)
Admission: AD | Admit: 2024-10-20 | Discharge: 2024-10-21 | Disposition: A | Discharge status: Home / Self Care | Attending: Obstetrics and Gynecology | Admitting: Obstetrics and Gynecology

## 2024-10-20 ENCOUNTER — Encounter (HOSPITAL_COMMUNITY): Payer: Self-pay | Admitting: *Deleted

## 2024-10-20 DIAGNOSIS — Z87891 Personal history of nicotine dependence: Secondary | ICD-10-CM | POA: Insufficient documentation

## 2024-10-20 DIAGNOSIS — O034 Incomplete spontaneous abortion without complication: Secondary | ICD-10-CM

## 2024-10-20 DIAGNOSIS — N939 Abnormal uterine and vaginal bleeding, unspecified: Secondary | ICD-10-CM | POA: Insufficient documentation

## 2024-10-20 DIAGNOSIS — R42 Dizziness and giddiness: Secondary | ICD-10-CM | POA: Insufficient documentation

## 2024-10-20 DIAGNOSIS — Z3A00 Weeks of gestation of pregnancy not specified: Secondary | ICD-10-CM | POA: Diagnosis not present

## 2024-10-20 LAB — POCT PREGNANCY, URINE: Preg Test, Ur: NEGATIVE

## 2024-10-20 LAB — CBC
HCT: 35.2 % — ABNORMAL LOW (ref 36.0–46.0)
Hemoglobin: 10.7 g/dL — ABNORMAL LOW (ref 12.0–15.0)
MCH: 24 pg — ABNORMAL LOW (ref 26.0–34.0)
MCHC: 30.4 g/dL (ref 30.0–36.0)
MCV: 78.9 fL — ABNORMAL LOW (ref 80.0–100.0)
Platelets: 346 K/uL (ref 150–400)
RBC: 4.46 MIL/uL (ref 3.87–5.11)
RDW: 13.4 % (ref 11.5–15.5)
WBC: 12.2 K/uL — ABNORMAL HIGH (ref 4.0–10.5)
nRBC: 0 % (ref 0.0–0.2)

## 2024-10-20 LAB — URINALYSIS, ROUTINE W REFLEX MICROSCOPIC
Bacteria, UA: NONE SEEN
Bilirubin Urine: NEGATIVE
Glucose, UA: NEGATIVE mg/dL
Ketones, ur: NEGATIVE mg/dL
Leukocytes,Ua: NEGATIVE
Nitrite: NEGATIVE
Protein, ur: NEGATIVE mg/dL
RBC / HPF: >50 RBC/hpf (ref 0–5)
Specific Gravity, Urine: 1.019 (ref 1.005–1.030)
pH: 5 (ref 5.0–8.0)

## 2024-10-20 LAB — HCG, QUANTITATIVE, PREGNANCY: hCG, Beta Chain, Quant, S: 6 m[IU]/mL — ABNORMAL HIGH (ref <5)

## 2024-10-20 NOTE — MAU Provider Note (Incomplete Revision)
 History     CSN: [id number]  Arrival date and time: 10/20/24 1906   None     Chief Complaint  Patient presents with  . Vaginal Bleeding   Cynthia Nolan , a  28 y.o. G2P1001 at Unknown presents to MAU with complaints of vaginal bleeding.  Patient reports ongoing vaginal bleeding since 09/02/2024 after a 10-week loss.  Patient reports at that time she took between 400 to 800 mcg of p.o. Cytotec  (unsure of dosing). She had vaginal bleeding and cramping at that time and OB/GYN has been continuously following hCG levels back down to 0.  Last hCG level was 25.  Patient reports continuous vaginal bleeding from light to heavy and passing clots as large as golf balls. Changing a pad 3-5 times per day completely saturated. She also reports ongoing menstrual-like cramping that is worse than at night.  Pain sometimes relieved with p.o. ibuprofen .  She denies intercourse since prior to pregnancy. Patient reports 3 to 4 weeks following SAB had a foul odor that resolved by week 5. She currently denies fever chills nausea and vomiting but endorses some dizziness occasionally.  She denies abnormal vaginal discharge outside of bleeding also denies urinary symptoms she has no other OB related complaints.       Vaginal Bleeding The patient's pertinent negatives include no pelvic pain or vaginal discharge. Associated symptoms include abdominal pain. Pertinent negatives include no back pain, chills, constipation, diarrhea, dysuria, fever, headaches, nausea or vomiting.    OB History     Gravida  2   Para  1   Term  1   Preterm      AB      Living  1      SAB      IAB      Ectopic      Multiple  0   Live Births  1           Past Medical History:  Diagnosis Date  . Medical history non-contributory     Past Surgical History:  Procedure Laterality Date  . CESAREAN SECTION N/A 07/05/2023   Procedure: CESAREAN SECTION;  Surgeon: Sudie Lavonia HERO, MD;  Location: [facility];  Service:  Obstetrics;  Laterality: N/A;  . WISDOM TOOTH EXTRACTION      Family History  Problem Relation Age of Onset  . Diabetes Sister     Social History   Tobacco Use  . Smoking status: Never  . Smokeless tobacco: Never  Vaping Use  . Vaping status: Former  Substance Use Topics  . Alcohol use: Yes    Comment: occasional  . Drug use: Never    Allergies:  Allergies  Allergen Reactions  . Amoxil [Amoxicillin] Rash    Medications Prior to Admission  Medication Sig Dispense Refill Last Dose/Taking  . hydrOXYzine  (ATARAX ) 25 MG tablet Take 1 tablet (25 mg total) by mouth every 6 (six) hours as needed for anxiety. 90 tablet 0   . misoprostol  (CYTOTEC ) 200 MCG tablet Place 4 tablets (800 mcg total) vaginally as directed. Repeat dose in 24 hours if no cramping or bleeding. 8 tablet 0   . Prenatal Vit-Fe Fumarate-FA (PRENATAL MULTIVITAMIN) TABS tablet Take 1 tablet by mouth daily.     . progesterone  (PROMETRIUM ) 200 MG capsule Take 1 capsule (200 mg total) by mouth daily. 30 capsule 1     Review of Systems  Constitutional:  Negative for chills, fatigue and fever.  Eyes:  Negative for pain and visual  disturbance.  Respiratory:  Negative for apnea, shortness of breath and wheezing.   Cardiovascular:  Negative for chest pain and palpitations.  Gastrointestinal:  Positive for abdominal pain. Negative for constipation, diarrhea, nausea and vomiting.  Genitourinary:  Positive for vaginal bleeding. Negative for difficulty urinating, dysuria, pelvic pain, vaginal discharge and vaginal pain.  Musculoskeletal:  Negative for back pain.  Neurological:  Negative for seizures, weakness and headaches.  Psychiatric/Behavioral:  Negative for suicidal ideas.    Physical Exam   Blood pressure (!) 151/80, pulse 98, temperature 99.4 F (37.4 C), resp. rate 17, height 5' 5 (1.651 m), weight 94.3 kg, SpO2 100%, unknown if currently breastfeeding.  Physical Exam Vitals and nursing note reviewed.   Constitutional:      General: She is not in acute distress.    Appearance: Normal appearance.  HENT:     Head: Normocephalic.  Pulmonary:     Effort: Pulmonary effort is normal.  Musculoskeletal:     Cervical back: Normal range of motion.  Skin:    General: Skin is warm and dry.  Neurological:     Mental Status: She is alert and oriented to person, place, and time.  Psychiatric:        Mood and Affect: Mood normal.     MAU Course  Procedures Orders Placed This Encounter  Procedures  . US  PELVIS TRANSVAGINAL NON-OB (TV ONLY)  . Urinalysis, Routine w reflex microscopic -Urine, Clean Catch  . CBC  . hCG, quantitative, pregnancy  . Pregnancy, urine POC    MDM - UPT negative in MAU. However concern for retained products given no cessation in bleeding. HCG levels ordered as previous HCG was still within positive range.  - Given chronically bleeding CBC ordered to assess HGB  - Transfer of care to Dr. Jomarie, at 2239. S. Emilio CNM   11:45p - Hgb 10.7 down from 13.1 two months prior.  Assessment and Plan

## 2024-10-20 NOTE — MAU Provider Note (Cosign Needed Addendum)
 History     CSN: [id number]  Arrival date and time: 10/20/24 1906   None     Chief Complaint  Patient presents with   Vaginal Bleeding   Cynthia Nolan , a  28 y.o. G2P1001 at Unknown presents to MAU with complaints of vaginal bleeding.  Patient reports ongoing vaginal bleeding since 09/02/2024 after a 10-week loss.  Patient reports at that time she took between 400 to 800 mcg of p.o. Cytotec  (unsure of dosing). She had vaginal bleeding and cramping at that time and OB/GYN has been continuously following hCG levels back down to 0.  Last hCG level was 25.  Patient reports continuous vaginal bleeding from light to heavy and passing clots as large as golf balls. Changing a pad 3-5 times per day completely saturated. She also reports ongoing menstrual-like cramping that is worse than at night.  Pain sometimes relieved with p.o. ibuprofen .  She denies intercourse since prior to pregnancy. Patient reports 3 to 4 weeks following SAB had a foul odor that resolved by week 5. She currently denies fever chills nausea and vomiting but endorses some dizziness occasionally.  She denies abnormal vaginal discharge outside of bleeding also denies urinary symptoms she has no other OB related complaints.       Vaginal Bleeding The patient's pertinent negatives include no pelvic pain or vaginal discharge. Associated symptoms include abdominal pain. Pertinent negatives include no back pain, chills, constipation, diarrhea, dysuria, fever, headaches, nausea or vomiting.    OB History     Gravida  2   Para  1   Term  1   Preterm      AB      Living  1      SAB      IAB      Ectopic      Multiple  0   Live Births  1           Past Medical History:  Diagnosis Date   Medical history non-contributory     Past Surgical History:  Procedure Laterality Date   CESAREAN SECTION N/A 07/05/2023   Procedure: CESAREAN SECTION;  Surgeon: Sudie Lavonia HERO, MD;  Location: [facility];  Service:  Obstetrics;  Laterality: N/A;   WISDOM TOOTH EXTRACTION      Family History  Problem Relation Age of Onset   Diabetes Sister     Social History   Tobacco Use   Smoking status: Never   Smokeless tobacco: Never  Vaping Use   Vaping status: Former  Substance Use Topics   Alcohol use: Yes    Comment: occasional   Drug use: Never    Allergies:  Allergies  Allergen Reactions   Amoxil [Amoxicillin] Rash    No medications prior to admission.    Review of Systems  Constitutional:  Negative for chills, fatigue and fever.  Eyes:  Negative for pain and visual disturbance.  Respiratory:  Negative for apnea, shortness of breath and wheezing.   Cardiovascular:  Negative for chest pain and palpitations.  Gastrointestinal:  Positive for abdominal pain. Negative for constipation, diarrhea, nausea and vomiting.  Genitourinary:  Positive for vaginal bleeding. Negative for difficulty urinating, dysuria, pelvic pain, vaginal discharge and vaginal pain.  Musculoskeletal:  Negative for back pain.  Neurological:  Negative for seizures, weakness and headaches.  Psychiatric/Behavioral:  Negative for suicidal ideas.    Physical Exam   Blood pressure 132/89, pulse 69, temperature 99.4 F (37.4 C), resp. rate 17, height 5' 5 (  1.651 m), weight 94.3 kg, SpO2 100%, unknown if currently breastfeeding.  Physical Exam Vitals and nursing note reviewed.  Constitutional:      General: She is not in acute distress.    Appearance: Normal appearance.  HENT:     Head: Normocephalic.  Pulmonary:     Effort: Pulmonary effort is normal.  Musculoskeletal:     Cervical back: Normal range of motion.  Skin:    General: Skin is warm and dry.  Neurological:     Mental Status: She is alert and oriented to person, place, and time.  Psychiatric:        Mood and Affect: Mood normal.     MAU Course   Orders Placed This Encounter  Procedures   US  PELVIS TRANSVAGINAL NON-OB (TV ONLY)   Urinalysis,  Routine w reflex microscopic -Urine, Clean Catch   CBC   hCG, quantitative, pregnancy   Pregnancy, urine POC   Discharge patient    MDM - UPT negative in MAU. However concern for retained products given no cessation in bleeding. HCG levels ordered as previous HCG was still within positive range.  - Given chronically bleeding CBC ordered to assess HGB  - Transfer of care to Dr. Jomarie, at [address]. S. Emilio CNM   1:30a - US  showing retained products. Discussed results with patient and offered 2nd round of misoprostol  vs D&E, patient prefers D&E. Advised patient to call clinic to schedule. Also spoke with Dr. Delana who will have the clinic reach out to the patient for scheduling. All questions answered prior to discharge.   Assessment and Plan   Discharge home in stable condition. Patient to receive call from clinic re: scheduling for procedure.   Charlie DELENA Jomarie, MD OB Fellow, [facility]

## 2024-10-20 NOTE — MAU Note (Addendum)
 Cynthia Nolan is a 28 y.o. at [redacted]w[redacted]d here in MAU reporting at 10wks she found out the baby did not have a heartbeat. She took medication to help her pass POC on 09/02/24. Reports she has bled with clots since then. The office has followed up with her on lab work. She spoke with them yesterday and today and was told to come to MAU. Using about 6 pads daily. Having some abd pain and pain at L hip. Ibuprofen  helps some with the pain but has not taken any today. Pt has scant blood on peripad she wore to the hospital. States she showered and put on new pad before coming to hosp  LMP: na Onset of complaint: 9/3 Pain score: 3 Vitals:   10/20/24 1914 10/20/24 1917  BP:  (!) 151/80  Pulse: 98   Resp: 17   Temp: 99.4 F (37.4 C)   SpO2: 100%      FHT: na  Lab orders placed from triage: u/a

## 2024-10-21 ENCOUNTER — Ambulatory Visit (HOSPITAL_COMMUNITY)

## 2024-10-21 ENCOUNTER — Ambulatory Visit (HOSPITAL_COMMUNITY): Admitting: Anesthesiology

## 2024-10-21 ENCOUNTER — Encounter (HOSPITAL_COMMUNITY)
Admission: RE | Disposition: A | Discharge status: Home / Self Care | Payer: Self-pay | Source: Home / Self Care | Attending: Obstetrics and Gynecology

## 2024-10-21 ENCOUNTER — Ambulatory Visit (HOSPITAL_COMMUNITY)
Admission: RE | Admit: 2024-10-21 | Discharge: 2024-10-21 | Disposition: A | Discharge status: Home / Self Care | Attending: Obstetrics and Gynecology | Admitting: Obstetrics and Gynecology

## 2024-10-21 ENCOUNTER — Encounter (HOSPITAL_COMMUNITY): Payer: Self-pay | Admitting: Obstetrics and Gynecology

## 2024-10-21 DIAGNOSIS — O034 Incomplete spontaneous abortion without complication: Secondary | ICD-10-CM | POA: Insufficient documentation

## 2024-10-21 DIAGNOSIS — Z3A00 Weeks of gestation of pregnancy not specified: Secondary | ICD-10-CM

## 2024-10-21 HISTORY — PX: DILATION AND EVACUATION: SHX1459

## 2024-10-21 LAB — TYPE AND SCREEN
ABO/RH(D): A POS
Antibody Screen: NEGATIVE

## 2024-10-21 SURGERY — DILATION AND EVACUATION, UTERUS
Anesthesia: Monitor Anesthesia Care

## 2024-10-21 MED ORDER — MENTHOL 3 MG MT LOZG: 1.0000 | LOZENGE | OROMUCOSAL | Status: DC | PRN

## 2024-10-21 MED ORDER — 0.9 % SODIUM CHLORIDE (POUR BTL) OPTIME
TOPICAL | Status: DC | PRN
  Administered 2024-10-21: 1000 mL

## 2024-10-21 MED ORDER — FENTANYL CITRATE (PF) 100 MCG/2ML IJ SOLN
INTRAMUSCULAR | Status: DC | PRN
  Administered 2024-10-21: 25 ug via INTRAVENOUS
  Administered 2024-10-21: 50 ug via INTRAVENOUS

## 2024-10-21 MED ORDER — METRONIDAZOLE 500 MG/100ML IV SOLN
500.0000 mg | INTRAVENOUS | Status: AC
  Administered 2024-10-21: 500 mg via INTRAVENOUS
  Filled 2024-10-21: qty 100

## 2024-10-21 MED ORDER — ACETAMINOPHEN 500 MG PO TABS: 1000.0000 mg | ORAL_TABLET | Freq: Four times a day (QID) | ORAL | Status: DC

## 2024-10-21 MED ORDER — DROPERIDOL 2.5 MG/ML IJ SOLN: 0.6250 mg | Freq: Once | INTRAMUSCULAR | Status: DC | PRN

## 2024-10-21 MED ORDER — PROPOFOL 10 MG/ML IV BOLUS
INTRAVENOUS | Status: AC
Start: 2024-10-21 — End: 2024-10-21
  Filled 2024-10-21: qty 20

## 2024-10-21 MED ORDER — DOXYCYCLINE HYCLATE 100 MG PO CAPS: 100.0000 mg | ORAL_CAPSULE | Freq: Two times a day (BID) | ORAL | 0 refills | Status: AC

## 2024-10-21 MED ORDER — CHLORHEXIDINE GLUCONATE 0.12 % MT SOLN
15.0000 mL | Freq: Once | OROMUCOSAL | Status: AC
  Administered 2024-10-21: 15 mL via OROMUCOSAL

## 2024-10-21 MED ORDER — OXYCODONE HCL 5 MG PO TABS
5.0000 mg | ORAL_TABLET | Freq: Once | ORAL | Status: AC | PRN
  Administered 2024-10-21: 5 mg via ORAL

## 2024-10-21 MED ORDER — PROPOFOL 500 MG/50ML IV EMUL
INTRAVENOUS | Status: DC | PRN
  Administered 2024-10-21 (×2): 20 mg via INTRAVENOUS
  Administered 2024-10-21: 125 ug/kg/min via INTRAVENOUS

## 2024-10-21 MED ORDER — ONDANSETRON HCL 4 MG PO TABS: 4.0000 mg | ORAL_TABLET | Freq: Four times a day (QID) | ORAL | Status: DC | PRN

## 2024-10-21 MED ORDER — OXYCODONE HCL 5 MG PO TABS: 5.0000 mg | ORAL_TABLET | ORAL | Status: DC | PRN

## 2024-10-21 MED ORDER — OXYCODONE-ACETAMINOPHEN 5-325 MG PO TABS: 1.0000 | ORAL_TABLET | ORAL | 0 refills | Status: AC | PRN

## 2024-10-21 MED ORDER — ACETAMINOPHEN 10 MG/ML IV SOLN: 1000.0000 mg | Freq: Once | INTRAVENOUS | Status: DC | PRN

## 2024-10-21 MED ORDER — ONDANSETRON HCL 4 MG/2ML IJ SOLN: 4.0000 mg | Freq: Four times a day (QID) | INTRAMUSCULAR | Status: DC | PRN

## 2024-10-21 MED ORDER — IBUPROFEN 600 MG PO TABS: 600.0000 mg | ORAL_TABLET | Freq: Four times a day (QID) | ORAL | Status: DC

## 2024-10-21 MED ORDER — ORAL CARE MOUTH RINSE: 15.0000 mL | Freq: Once | OROMUCOSAL | Status: AC

## 2024-10-21 MED ORDER — CHLORHEXIDINE GLUCONATE 0.12 % MT SOLN
15.0000 mL | Freq: Once | OROMUCOSAL | Status: DC
  Filled 2024-10-21: qty 15

## 2024-10-21 MED ORDER — ORAL CARE MOUTH RINSE: 15.0000 mL | Freq: Once | OROMUCOSAL | Status: DC

## 2024-10-21 MED ORDER — LIDOCAINE HCL (CARDIAC) PF 100 MG/5ML IV SOSY
PREFILLED_SYRINGE | INTRAVENOUS | Status: DC | PRN
  Administered 2024-10-21: 30 mg via INTRATRACHEAL

## 2024-10-21 MED ORDER — FENTANYL CITRATE (PF) 100 MCG/2ML IJ SOLN
25.0000 ug | INTRAMUSCULAR | Status: DC | PRN
  Administered 2024-10-21: 50 ug via INTRAVENOUS

## 2024-10-21 MED ORDER — FENTANYL CITRATE (PF) 100 MCG/2ML IJ SOLN
INTRAMUSCULAR | Status: AC
  Filled 2024-10-21: qty 2

## 2024-10-21 MED ORDER — ONDANSETRON HCL 4 MG/2ML IJ SOLN
INTRAMUSCULAR | Status: DC | PRN
  Administered 2024-10-21: 4 mg via INTRAVENOUS

## 2024-10-21 MED ORDER — OXYCODONE HCL 5 MG PO TABS
ORAL_TABLET | ORAL | Status: AC
  Filled 2024-10-21: qty 1

## 2024-10-21 MED ORDER — KETOROLAC TROMETHAMINE 30 MG/ML IJ SOLN: 30.0000 mg | Freq: Once | INTRAMUSCULAR | Status: DC

## 2024-10-21 MED ORDER — IBUPROFEN 600 MG PO TABS: 600.0000 mg | ORAL_TABLET | Freq: Four times a day (QID) | ORAL | 1 refills | Status: AC | PRN

## 2024-10-21 MED ORDER — LACTATED RINGERS IV SOLN: INTRAVENOUS | Status: DC

## 2024-10-21 MED ORDER — MIDAZOLAM HCL 2 MG/2ML IJ SOLN
INTRAMUSCULAR | Status: AC
  Filled 2024-10-21: qty 2

## 2024-10-21 MED ORDER — POVIDONE-IODINE 10 % EX SWAB
2.0000 | Freq: Once | CUTANEOUS | Status: AC
  Administered 2024-10-21: 2 via TOPICAL

## 2024-10-21 MED ORDER — CIPROFLOXACIN IN D5W 400 MG/200ML IV SOLN
400.0000 mg | INTRAVENOUS | Status: AC
  Administered 2024-10-21: 400 mg via INTRAVENOUS
  Filled 2024-10-21: qty 200

## 2024-10-21 MED ORDER — LIDOCAINE HCL 1 % IJ SOLN
INTRAMUSCULAR | Status: DC | PRN
  Administered 2024-10-21: 10 mL

## 2024-10-21 MED ORDER — LACTATED RINGERS IV SOLN
INTRAVENOUS | Status: DC
Start: 2024-10-21 — End: 2024-10-21

## 2024-10-21 MED ORDER — MIDAZOLAM HCL 5 MG/5ML IJ SOLN
INTRAMUSCULAR | Status: DC | PRN
  Administered 2024-10-21: 2 mg via INTRAVENOUS

## 2024-10-21 MED ORDER — LIDOCAINE 2% (20 MG/ML) 5 ML SYRINGE
INTRAMUSCULAR | Status: AC
  Filled 2024-10-21: qty 5

## 2024-10-21 MED ORDER — OXYCODONE HCL 5 MG/5ML PO SOLN: 5.0000 mg | Freq: Once | ORAL | Status: AC | PRN

## 2024-10-21 MED ORDER — PHENYLEPHRINE 80 MCG/ML (10ML) SYRINGE FOR IV PUSH (FOR BLOOD PRESSURE SUPPORT)
PREFILLED_SYRINGE | INTRAVENOUS | Status: DC | PRN
  Administered 2024-10-21: 40 ug via INTRAVENOUS

## 2024-10-21 MED ORDER — LIDOCAINE HCL 1 % IJ SOLN
INTRAMUSCULAR | Status: AC
  Filled 2024-10-21: qty 20

## 2024-10-21 SURGICAL SUPPLY — 17 items
COVER MAYO STAND STRL (DRAPES) ×1 IMPLANT
FILTER UTR ASPR ASSEMBLY (MISCELLANEOUS) ×1 IMPLANT
GLOVE BIO SURGEON STRL SZ 6.5 (GLOVE) ×1 IMPLANT
GLOVE BIOGEL PI IND STRL 7.0 (GLOVE) ×1 IMPLANT
GOWN STRL REUS W/ TWL LRG LVL3 (GOWN DISPOSABLE) ×2 IMPLANT
HOSE CONNECTING 18IN BERKELEY (TUBING) ×1 IMPLANT
KIT BERKELEY 1ST TRI 3/8 NO TR (MISCELLANEOUS) ×1 IMPLANT
KIT BERKELEY 1ST TRIMESTER 3/8 (MISCELLANEOUS) ×1 IMPLANT
PACK VAGINAL MINOR WOMEN LF (CUSTOM PROCEDURE TRAY) ×1 IMPLANT
PAD OB MATERNITY 11 LF (PERSONAL CARE ITEMS) ×1 IMPLANT
SET BERKELEY SUCTION TUBING (SUCTIONS) ×1 IMPLANT
TOWEL GREEN STERILE FF (TOWEL DISPOSABLE) ×2 IMPLANT
UNDERPAD 30X36 HEAVY ABSORB (UNDERPADS AND DIAPERS) ×1 IMPLANT
VACURETTE 10 RIGID CVD (CANNULA) IMPLANT
VACURETTE 7MM CVD STRL WRAP (CANNULA) IMPLANT
VACURETTE 8 RIGID CVD (CANNULA) IMPLANT
VACURETTE 9 RIGID CVD (CANNULA) IMPLANT

## 2024-10-21 NOTE — Anesthesia Preprocedure Evaluation (Addendum)
 Anesthesia Evaluation  Patient identified by MRN, date of birth, ID band Patient awake    Reviewed: Allergy & Precautions, NPO status , Patient's Chart, lab work & pertinent test results  Airway Mallampati: I  TM Distance: >3 FB Neck ROM: Full    Dental  (+) Teeth Intact, Dental Advisory Given   Pulmonary neg pulmonary ROS   breath sounds clear to auscultation       Cardiovascular negative cardio ROS  Rhythm:Regular Rate:Normal     Neuro/Psych negative neurological ROS  negative psych ROS   GI/Hepatic negative GI ROS, Neg liver ROS,,,  Endo/Other  negative endocrine ROS    Renal/GU negative Renal ROS     Musculoskeletal negative musculoskeletal ROS (+)    Abdominal   Peds  Hematology negative hematology ROS (+)   Anesthesia Other Findings   Reproductive/Obstetrics                              Anesthesia Physical Anesthesia Plan  ASA: 1  Anesthesia Plan: MAC   Post-op Pain Management: Minimal or no pain anticipated   Induction: Intravenous  PONV Risk Score and Plan: 0 and Propofol infusion and Midazolam  Airway Management Planned: Natural Airway and Nasal Cannula  Additional Equipment: None  Intra-op Plan:   Post-operative Plan:   Informed Consent: I have reviewed the patients History and Physical, chart, labs and discussed the procedure including the risks, benefits and alternatives for the proposed anesthesia with the patient or authorized representative who has indicated his/her understanding and acceptance.       Plan Discussed with: CRNA  Anesthesia Plan Comments:         Anesthesia Quick Evaluation

## 2024-10-21 NOTE — Discharge Instructions (Addendum)
 Call office with any concerns [phone]

## 2024-10-21 NOTE — Op Note (Signed)
 Operative Note    Preoperative Diagnosis Retained products of conception following miscarriage Abnormal uterine bleeding    Postoperative Diagnosis: Same    Procedure: Ultrasound guided suction dilation and curettage of uterine   Surgeon: Delana, C DO   Anesthesia: Mac  Fluids: LR EBL: 20ml UOP: voided prior to OR Meds: Cipro and flagyl    Findings: Uterus sounded to 9cm. Anteverted   Specimen: products of conception to pathology    Procedure Note Consent was confirmed in PACU  Patient was taken to the operating room where MAC anesthesia was administered without difficulty. She was then prepped and draped in the normal sterile fashion while in the dorsal lithotomy position. An appropriate time out was performed.  An exam under anesthesia noted an anteverted uterus.  A speculum was then placed within the vagina and the anterior lip of the cervix identified and grasped with a single toothed tenaculum. Uterus was then sounded to 9cm.  The Pratt dilators were utilized to dilate the cervix up to approximately 25.  Under ultrasound guidance. A size 8 rigid cannula was then inserted into the uterine cavity and suction begun. There was small amount of products evacuated. After 4 passes, a sharp curettage was then performed. Four more passes with the suction cannula were done. Scant amount of products were returned this time. A final currettage noted a gritty texture in all four quadrants.  Products of conception noted via ultrasound at beginning of the case were noted to have been fully evacuated; no vascularity. Endometrial stripe thin. At this point, the tenaculum was removed; site was noted to be hemostatic.  There was scant to no bleeding from cervix. The speculum was thus removed as well. Pt was cleaned and awakened. She was taken to the recovery room in stable condition.  Counts were correct per nursing staff during and at the end of the procedure

## 2024-10-21 NOTE — Anesthesia Procedure Notes (Signed)
 Procedure Name: MAC Date/Time: 10/21/2024 12:16 PM  Performed by: Julien Manus, CRNAPre-anesthesia Checklist: Patient identified, Emergency Drugs available, Suction available, Patient being monitored and Timeout performed Patient Re-evaluated:Patient Re-evaluated prior to induction Oxygen Delivery Method: Simple face mask

## 2024-10-21 NOTE — H&P (Signed)
 Cynthia Nolan is an 28 y.o.G2 P71  female who is s/p mab x 4 weeks with retained products of conception.  Pt took cytotec  for passage however has continued to have irregular bleeding which has gotten increasingly heavy. She denies fever or chills but admits to recent malaise and fatigue and understandably, concern Pt was seen in MAU earlier this am and noted to have retained products of conception but afebrile. Plan for outpt mgmt   Pertinent Gynecological History: Menses: flow is moderate Bleeding: dysfunctional uterine bleeding Contraception: none DES exposure: denies Blood transfusions: none Sexually transmitted diseases: no past history Previous GYN Procedures: n/a  OB History: G2, P1011   Menstrual History: Menarche age: 22 No LMP recorded.    Past Medical History:  Diagnosis Date   Medical history non-contributory     Past Surgical History:  Procedure Laterality Date   CESAREAN SECTION N/A 07/05/2023   Procedure: CESAREAN SECTION;  Surgeon: Sudie Lavonia HERO, MD;  Location: [facility];  Service: Obstetrics;  Laterality: N/A;   WISDOM TOOTH EXTRACTION      Family History  Problem Relation Age of Onset   Diabetes Sister     Social History:  reports that she has never smoked. She has never used smokeless tobacco. She reports current alcohol use. She reports that she does not use drugs.  Allergies:  Allergies  Allergen Reactions   Amoxil [Amoxicillin] Rash    No medications prior to admission.    Review of Systems  Constitutional:  Positive for fatigue. Negative for activity change.  Eyes:  Negative for photophobia and visual disturbance.  Respiratory:  Negative for chest tightness and shortness of breath.   Gastrointestinal:  Negative for abdominal pain, diarrhea and nausea.  Genitourinary:  Positive for vaginal bleeding. Negative for pelvic pain.  Musculoskeletal:  Negative for back pain.  Neurological:  Positive for weakness. Negative for tremors.   Psychiatric/Behavioral:  The patient is nervous/anxious.     unknown if currently breastfeeding. Physical Exam Vitals and nursing note reviewed. Exam conducted with a chaperone present.  Constitutional:      Appearance: Normal appearance.  Cardiovascular:     Rate and Rhythm: Normal rate.     Pulses: Normal pulses.  Pulmonary:     Effort: Pulmonary effort is normal.  Abdominal:     Palpations: Abdomen is soft.  Musculoskeletal:        General: Normal range of motion.     Cervical back: Normal range of motion.  Skin:    General: Skin is warm and dry.     Capillary Refill: Capillary refill takes 2 to 3 seconds.  Neurological:     General: No focal deficit present.     Mental Status: She is alert and oriented to person, place, and time. Mental status is at baseline.  Psychiatric:        Mood and Affect: Mood normal.        Behavior: Behavior normal.        Thought Content: Thought content normal.        Judgment: Judgment normal.     Results for orders placed or performed during the hospital encounter of 10/20/24 (from the past 24 hours)  Urinalysis, Routine w reflex microscopic -Urine, Clean Catch     Status: Abnormal   Collection Time: 10/20/24  7:30 PM  Result Value Ref Range   Color, Urine YELLOW YELLOW   APPearance CLEAR CLEAR   Specific Gravity, Urine 1.019 1.005 - 1.030   pH  5.0 5.0 - 8.0   Glucose, UA NEGATIVE NEGATIVE mg/dL   Hgb urine dipstick LARGE (A) NEGATIVE   Bilirubin Urine NEGATIVE NEGATIVE   Ketones, ur NEGATIVE NEGATIVE mg/dL   Protein, ur NEGATIVE NEGATIVE mg/dL   Nitrite NEGATIVE NEGATIVE   Leukocytes,Ua NEGATIVE NEGATIVE   RBC / HPF >50 0 - 5 RBC/hpf   WBC, UA 0-5 0 - 5 WBC/hpf   Bacteria, UA NONE SEEN NONE SEEN   Squamous Epithelial / HPF 0-5 0 - 5 /HPF   Mucus PRESENT   Pregnancy, urine POC     Status: None   Collection Time: 10/20/24  8:04 PM  Result Value Ref Range   Preg Test, Ur NEGATIVE NEGATIVE  CBC     Status: Abnormal    Collection Time: 10/20/24 10:45 PM  Result Value Ref Range   WBC 12.2 (H) 4.0 - 10.5 K/uL   RBC 4.46 3.87 - 5.11 MIL/uL   Hemoglobin 10.7 (L) 12.0 - 15.0 g/dL   HCT 64.7 (L) 63.9 - 53.9 %   MCV 78.9 (L) 80.0 - 100.0 fL   MCH 24.0 (L) 26.0 - 34.0 pg   MCHC 30.4 30.0 - 36.0 g/dL   RDW 86.5 88.4 - 84.4 %   Platelets 346 150 - 400 K/uL   nRBC 0.0 0.0 - 0.2 %  hCG, quantitative, pregnancy     Status: Abnormal   Collection Time: 10/20/24 10:45 PM  Result Value Ref Range   hCG, Beta Chain, Quant, S 6 (H) <5 mIU/mL    US  PELVIS TRANSVAGINAL NON-OB (TV ONLY) Result Date: 10/21/2024 CLINICAL DATA:  Vaginal bleeding EXAM: ULTRASOUND PELVIS TRANSVAGINAL TECHNIQUE: Transvaginal ultrasound examination of the pelvis was performed including evaluation of the uterus, ovaries, adnexal regions, and pelvic cul-de-sac. COMPARISON:  08/10/2024 FINDINGS: Uterus Measurements: 8.6 x 6.6 x 6.1 cm. = volume: 182 mL. No fibroids or other mass visualized. Endometrium Thickness: 19 mm. Heterogeneous endometrium is noted with increased vascularity on Doppler evaluation. These changes are suspicious for retained products of conception. Previously seen gestational sac is no longer identified. Right ovary Measurements: 2.3 x 1.3 x 1.4 cm. = volume: 2.3 mL. Normal appearance/no adnexal mass. Left ovary Measurements: 2.0 x 1.3 x 1.7 cm. = volume: 2.2 mL. Normal appearance/no adnexal mass. Other findings:  No abnormal free fluid IMPRESSION: Heterogeneous endometrium with increased vascularity suspicious for retained products of conception. Previously seen gestational sac is not visualized. The remainder of the exam is within normal limits. It should be noted delay in interpretation is due to the study not being available to the reading radiologist till 1:54 a.m. Electronically Signed   By: Oneil Devonshire M.D.   On: 10/21/2024 02:04    Assessment/Plan: 28yo G2P1011 with retained products of conception following mab - Admit  -  [facility] protocol  - Verify consent  - To OR when ready    ^^ of note pt [facility] offered earlier slot for procedure but requested later time **  Ethen Bannan W Jaree Trinka 10/21/2024, 6:32 AM

## 2024-10-21 NOTE — Transfer of Care (Signed)
 Immediate Anesthesia Transfer of Care Note  Patient: Cynthia Nolan  Procedure(s) Performed: DILATION AND EVACUATION, UTERUS  Patient Location: PACU  Anesthesia Type:MAC  Level of Consciousness: awake and alert   Airway & Oxygen Therapy: Patient Spontanous Breathing  Post-op Assessment: Report given to RN and Post -op Vital signs reviewed and stable  Post vital signs: Reviewed and stable  Last Vitals:  Vitals Value Taken Time  BP 138/83 10/21/24 12:49  Temp    Pulse 80 10/21/24 12:52  Resp 11 10/21/24 12:52  SpO2 97 % 10/21/24 12:52  Vitals shown include unfiled device data.  Last Pain:  Vitals:   10/21/24 1036  TempSrc:   PainSc: 3       Patients Stated Pain Goal: 2 (10/21/24 1036)  Complications: No notable events documented.

## 2024-10-21 NOTE — Anesthesia Postprocedure Evaluation (Signed)
 Anesthesia Post Note  Patient: Cynthia Nolan  Procedure(s) Performed: DILATION AND EVACUATION, UTERUS     Patient location during evaluation: PACU Anesthesia Type: MAC Level of consciousness: awake and alert Pain management: pain level controlled Vital Signs Assessment: post-procedure vital signs reviewed and stable Respiratory status: spontaneous breathing, nonlabored ventilation, respiratory function stable and patient connected to nasal cannula oxygen Cardiovascular status: stable and blood pressure returned to baseline Postop Assessment: no apparent nausea or vomiting Anesthetic complications: no   No notable events documented.  Last Vitals:  Vitals:   10/21/24 1317 10/21/24 1330  BP:  130/89  Pulse: 83 83  Resp: 13 19  Temp:  36.5 C  SpO2: 98% 97%    Last Pain:  Vitals:   10/21/24 1317  TempSrc:   PainSc: 5                  [name]

## 2024-10-21 NOTE — Progress Notes (Signed)
 Dr Jomarie in to see pt and discuss test results. Written and verbal d/c instructions given and pt voiced understanding

## 2024-10-21 NOTE — Discharge Instructions (Addendum)
 You came into the Maternity Assessment Unit because you have been having bleeding since the beginning of September. We did an ultrasound which showed retained products. We discussed doing medication for a second time or doing a procedure - you prefer to do the procedure. Please call your clinic in the morning to move forward with scheduling the procedure. I will also speak with the on-call provider to help with scheduling.

## 2024-10-22 ENCOUNTER — Encounter (HOSPITAL_COMMUNITY): Payer: Self-pay | Admitting: Obstetrics and Gynecology

## 2024-10-22 LAB — SURGICAL PATHOLOGY

## 2024-10-22 LAB — POCT PREGNANCY, URINE: Preg Test, Ur: NEGATIVE
